# Patient Record
Sex: Female | Born: 1975
Health system: Southern US, Community
[De-identification: ages and names within clinical notes are randomized; demographics above are authoritative.]

## PROBLEM LIST (undated history)

## (undated) DIAGNOSIS — D649 Anemia, unspecified: Secondary | ICD-10-CM

---

## 2017-02-17 DIAGNOSIS — J3081 Allergic rhinitis due to animal (cat) (dog) hair and dander: Secondary | ICD-10-CM | POA: Diagnosis not present

## 2017-02-17 DIAGNOSIS — J301 Allergic rhinitis due to pollen: Secondary | ICD-10-CM | POA: Diagnosis not present

## 2017-02-17 DIAGNOSIS — J3089 Other allergic rhinitis: Secondary | ICD-10-CM | POA: Diagnosis not present

## 2017-03-03 DIAGNOSIS — J3089 Other allergic rhinitis: Secondary | ICD-10-CM | POA: Diagnosis not present

## 2017-03-03 DIAGNOSIS — J3081 Allergic rhinitis due to animal (cat) (dog) hair and dander: Secondary | ICD-10-CM | POA: Diagnosis not present

## 2017-03-03 DIAGNOSIS — J301 Allergic rhinitis due to pollen: Secondary | ICD-10-CM | POA: Diagnosis not present

## 2017-03-17 DIAGNOSIS — J3081 Allergic rhinitis due to animal (cat) (dog) hair and dander: Secondary | ICD-10-CM | POA: Diagnosis not present

## 2017-03-17 DIAGNOSIS — J301 Allergic rhinitis due to pollen: Secondary | ICD-10-CM | POA: Diagnosis not present

## 2017-03-17 DIAGNOSIS — J3089 Other allergic rhinitis: Secondary | ICD-10-CM | POA: Diagnosis not present

## 2017-03-28 DIAGNOSIS — Z08 Encounter for follow-up examination after completed treatment for malignant neoplasm: Secondary | ICD-10-CM | POA: Diagnosis not present

## 2017-03-28 DIAGNOSIS — D224 Melanocytic nevi of scalp and neck: Secondary | ICD-10-CM | POA: Diagnosis not present

## 2017-03-28 DIAGNOSIS — L200 Besnier's prurigo: Secondary | ICD-10-CM | POA: Diagnosis not present

## 2017-03-28 DIAGNOSIS — Z85828 Personal history of other malignant neoplasm of skin: Secondary | ICD-10-CM | POA: Diagnosis not present

## 2017-03-28 DIAGNOSIS — L570 Actinic keratosis: Secondary | ICD-10-CM | POA: Diagnosis not present

## 2017-03-30 DIAGNOSIS — J3081 Allergic rhinitis due to animal (cat) (dog) hair and dander: Secondary | ICD-10-CM | POA: Diagnosis not present

## 2017-03-30 DIAGNOSIS — J301 Allergic rhinitis due to pollen: Secondary | ICD-10-CM | POA: Diagnosis not present

## 2017-03-30 DIAGNOSIS — J3089 Other allergic rhinitis: Secondary | ICD-10-CM | POA: Diagnosis not present

## 2017-04-14 DIAGNOSIS — J3089 Other allergic rhinitis: Secondary | ICD-10-CM | POA: Diagnosis not present

## 2017-04-14 DIAGNOSIS — J3081 Allergic rhinitis due to animal (cat) (dog) hair and dander: Secondary | ICD-10-CM | POA: Diagnosis not present

## 2017-04-14 DIAGNOSIS — J301 Allergic rhinitis due to pollen: Secondary | ICD-10-CM | POA: Diagnosis not present

## 2017-04-19 DIAGNOSIS — Z6828 Body mass index (BMI) 28.0-28.9, adult: Secondary | ICD-10-CM | POA: Diagnosis not present

## 2017-04-19 DIAGNOSIS — Z01419 Encounter for gynecological examination (general) (routine) without abnormal findings: Secondary | ICD-10-CM | POA: Diagnosis not present

## 2017-05-03 DIAGNOSIS — J301 Allergic rhinitis due to pollen: Secondary | ICD-10-CM | POA: Diagnosis not present

## 2017-05-03 DIAGNOSIS — J3081 Allergic rhinitis due to animal (cat) (dog) hair and dander: Secondary | ICD-10-CM | POA: Diagnosis not present

## 2017-05-03 DIAGNOSIS — J3089 Other allergic rhinitis: Secondary | ICD-10-CM | POA: Diagnosis not present

## 2017-05-19 DIAGNOSIS — J3081 Allergic rhinitis due to animal (cat) (dog) hair and dander: Secondary | ICD-10-CM | POA: Diagnosis not present

## 2017-05-19 DIAGNOSIS — J3089 Other allergic rhinitis: Secondary | ICD-10-CM | POA: Diagnosis not present

## 2017-05-19 DIAGNOSIS — J301 Allergic rhinitis due to pollen: Secondary | ICD-10-CM | POA: Diagnosis not present

## 2017-05-31 DIAGNOSIS — J3089 Other allergic rhinitis: Secondary | ICD-10-CM | POA: Diagnosis not present

## 2017-05-31 DIAGNOSIS — J301 Allergic rhinitis due to pollen: Secondary | ICD-10-CM | POA: Diagnosis not present

## 2017-05-31 DIAGNOSIS — J3081 Allergic rhinitis due to animal (cat) (dog) hair and dander: Secondary | ICD-10-CM | POA: Diagnosis not present

## 2017-06-08 DIAGNOSIS — J3089 Other allergic rhinitis: Secondary | ICD-10-CM | POA: Diagnosis not present

## 2017-06-08 DIAGNOSIS — J3081 Allergic rhinitis due to animal (cat) (dog) hair and dander: Secondary | ICD-10-CM | POA: Diagnosis not present

## 2017-06-08 DIAGNOSIS — J301 Allergic rhinitis due to pollen: Secondary | ICD-10-CM | POA: Diagnosis not present

## 2017-06-14 DIAGNOSIS — J3081 Allergic rhinitis due to animal (cat) (dog) hair and dander: Secondary | ICD-10-CM | POA: Diagnosis not present

## 2017-06-14 DIAGNOSIS — J301 Allergic rhinitis due to pollen: Secondary | ICD-10-CM | POA: Diagnosis not present

## 2017-06-14 DIAGNOSIS — J3089 Other allergic rhinitis: Secondary | ICD-10-CM | POA: Diagnosis not present

## 2017-06-29 DIAGNOSIS — J3081 Allergic rhinitis due to animal (cat) (dog) hair and dander: Secondary | ICD-10-CM | POA: Diagnosis not present

## 2017-06-29 DIAGNOSIS — J301 Allergic rhinitis due to pollen: Secondary | ICD-10-CM | POA: Diagnosis not present

## 2017-06-29 DIAGNOSIS — J3089 Other allergic rhinitis: Secondary | ICD-10-CM | POA: Diagnosis not present

## 2017-06-30 DIAGNOSIS — M25521 Pain in right elbow: Secondary | ICD-10-CM | POA: Diagnosis not present

## 2017-07-13 DIAGNOSIS — J3081 Allergic rhinitis due to animal (cat) (dog) hair and dander: Secondary | ICD-10-CM | POA: Diagnosis not present

## 2017-07-13 DIAGNOSIS — J3089 Other allergic rhinitis: Secondary | ICD-10-CM | POA: Diagnosis not present

## 2017-07-13 DIAGNOSIS — J301 Allergic rhinitis due to pollen: Secondary | ICD-10-CM | POA: Diagnosis not present

## 2017-07-28 DIAGNOSIS — J301 Allergic rhinitis due to pollen: Secondary | ICD-10-CM | POA: Diagnosis not present

## 2017-07-28 DIAGNOSIS — J3081 Allergic rhinitis due to animal (cat) (dog) hair and dander: Secondary | ICD-10-CM | POA: Diagnosis not present

## 2017-07-28 DIAGNOSIS — J3089 Other allergic rhinitis: Secondary | ICD-10-CM | POA: Diagnosis not present

## 2017-08-11 DIAGNOSIS — J3089 Other allergic rhinitis: Secondary | ICD-10-CM | POA: Diagnosis not present

## 2017-08-11 DIAGNOSIS — J301 Allergic rhinitis due to pollen: Secondary | ICD-10-CM | POA: Diagnosis not present

## 2017-08-11 DIAGNOSIS — J3081 Allergic rhinitis due to animal (cat) (dog) hair and dander: Secondary | ICD-10-CM | POA: Diagnosis not present

## 2017-08-25 DIAGNOSIS — J3081 Allergic rhinitis due to animal (cat) (dog) hair and dander: Secondary | ICD-10-CM | POA: Diagnosis not present

## 2017-08-25 DIAGNOSIS — J3089 Other allergic rhinitis: Secondary | ICD-10-CM | POA: Diagnosis not present

## 2017-08-25 DIAGNOSIS — J301 Allergic rhinitis due to pollen: Secondary | ICD-10-CM | POA: Diagnosis not present

## 2017-09-08 DIAGNOSIS — J301 Allergic rhinitis due to pollen: Secondary | ICD-10-CM | POA: Diagnosis not present

## 2017-09-08 DIAGNOSIS — J3089 Other allergic rhinitis: Secondary | ICD-10-CM | POA: Diagnosis not present

## 2017-09-08 DIAGNOSIS — J3081 Allergic rhinitis due to animal (cat) (dog) hair and dander: Secondary | ICD-10-CM | POA: Diagnosis not present

## 2017-09-13 DIAGNOSIS — Z23 Encounter for immunization: Secondary | ICD-10-CM | POA: Diagnosis not present

## 2017-09-29 DIAGNOSIS — J3081 Allergic rhinitis due to animal (cat) (dog) hair and dander: Secondary | ICD-10-CM | POA: Diagnosis not present

## 2017-09-29 DIAGNOSIS — J301 Allergic rhinitis due to pollen: Secondary | ICD-10-CM | POA: Diagnosis not present

## 2017-09-29 DIAGNOSIS — J3089 Other allergic rhinitis: Secondary | ICD-10-CM | POA: Diagnosis not present

## 2017-10-12 DIAGNOSIS — Z1231 Encounter for screening mammogram for malignant neoplasm of breast: Secondary | ICD-10-CM | POA: Diagnosis not present

## 2017-10-13 DIAGNOSIS — J301 Allergic rhinitis due to pollen: Secondary | ICD-10-CM | POA: Diagnosis not present

## 2017-10-13 DIAGNOSIS — J3089 Other allergic rhinitis: Secondary | ICD-10-CM | POA: Diagnosis not present

## 2017-10-13 DIAGNOSIS — J3081 Allergic rhinitis due to animal (cat) (dog) hair and dander: Secondary | ICD-10-CM | POA: Diagnosis not present

## 2017-10-31 DIAGNOSIS — J3081 Allergic rhinitis due to animal (cat) (dog) hair and dander: Secondary | ICD-10-CM | POA: Diagnosis not present

## 2017-10-31 DIAGNOSIS — J3089 Other allergic rhinitis: Secondary | ICD-10-CM | POA: Diagnosis not present

## 2017-10-31 DIAGNOSIS — J069 Acute upper respiratory infection, unspecified: Secondary | ICD-10-CM | POA: Diagnosis not present

## 2017-10-31 DIAGNOSIS — J301 Allergic rhinitis due to pollen: Secondary | ICD-10-CM | POA: Diagnosis not present

## 2017-11-10 DIAGNOSIS — J301 Allergic rhinitis due to pollen: Secondary | ICD-10-CM | POA: Diagnosis not present

## 2017-11-10 DIAGNOSIS — J3089 Other allergic rhinitis: Secondary | ICD-10-CM | POA: Diagnosis not present

## 2017-11-10 DIAGNOSIS — J3081 Allergic rhinitis due to animal (cat) (dog) hair and dander: Secondary | ICD-10-CM | POA: Diagnosis not present

## 2017-11-17 DIAGNOSIS — J3081 Allergic rhinitis due to animal (cat) (dog) hair and dander: Secondary | ICD-10-CM | POA: Diagnosis not present

## 2017-11-17 DIAGNOSIS — J3089 Other allergic rhinitis: Secondary | ICD-10-CM | POA: Diagnosis not present

## 2017-11-17 DIAGNOSIS — J301 Allergic rhinitis due to pollen: Secondary | ICD-10-CM | POA: Diagnosis not present

## 2017-12-15 DIAGNOSIS — J3081 Allergic rhinitis due to animal (cat) (dog) hair and dander: Secondary | ICD-10-CM | POA: Diagnosis not present

## 2017-12-15 DIAGNOSIS — J301 Allergic rhinitis due to pollen: Secondary | ICD-10-CM | POA: Diagnosis not present

## 2017-12-15 DIAGNOSIS — J3089 Other allergic rhinitis: Secondary | ICD-10-CM | POA: Diagnosis not present

## 2018-01-05 DIAGNOSIS — J301 Allergic rhinitis due to pollen: Secondary | ICD-10-CM | POA: Diagnosis not present

## 2018-01-05 DIAGNOSIS — J3081 Allergic rhinitis due to animal (cat) (dog) hair and dander: Secondary | ICD-10-CM | POA: Diagnosis not present

## 2018-01-05 DIAGNOSIS — J3089 Other allergic rhinitis: Secondary | ICD-10-CM | POA: Diagnosis not present

## 2018-01-26 DIAGNOSIS — J3089 Other allergic rhinitis: Secondary | ICD-10-CM | POA: Diagnosis not present

## 2018-01-26 DIAGNOSIS — J301 Allergic rhinitis due to pollen: Secondary | ICD-10-CM | POA: Diagnosis not present

## 2018-01-26 DIAGNOSIS — J3081 Allergic rhinitis due to animal (cat) (dog) hair and dander: Secondary | ICD-10-CM | POA: Diagnosis not present

## 2018-02-16 DIAGNOSIS — J3089 Other allergic rhinitis: Secondary | ICD-10-CM | POA: Diagnosis not present

## 2018-02-16 DIAGNOSIS — J3081 Allergic rhinitis due to animal (cat) (dog) hair and dander: Secondary | ICD-10-CM | POA: Diagnosis not present

## 2018-02-16 DIAGNOSIS — J301 Allergic rhinitis due to pollen: Secondary | ICD-10-CM | POA: Diagnosis not present

## 2018-03-09 DIAGNOSIS — J3081 Allergic rhinitis due to animal (cat) (dog) hair and dander: Secondary | ICD-10-CM | POA: Diagnosis not present

## 2018-03-09 DIAGNOSIS — J301 Allergic rhinitis due to pollen: Secondary | ICD-10-CM | POA: Diagnosis not present

## 2018-03-09 DIAGNOSIS — J3089 Other allergic rhinitis: Secondary | ICD-10-CM | POA: Diagnosis not present

## 2018-03-28 DIAGNOSIS — R590 Localized enlarged lymph nodes: Secondary | ICD-10-CM | POA: Diagnosis not present

## 2018-03-29 DIAGNOSIS — Z85828 Personal history of other malignant neoplasm of skin: Secondary | ICD-10-CM | POA: Diagnosis not present

## 2018-03-29 DIAGNOSIS — J301 Allergic rhinitis due to pollen: Secondary | ICD-10-CM | POA: Diagnosis not present

## 2018-03-29 DIAGNOSIS — D225 Melanocytic nevi of trunk: Secondary | ICD-10-CM | POA: Diagnosis not present

## 2018-03-29 DIAGNOSIS — J3081 Allergic rhinitis due to animal (cat) (dog) hair and dander: Secondary | ICD-10-CM | POA: Diagnosis not present

## 2018-03-29 DIAGNOSIS — L821 Other seborrheic keratosis: Secondary | ICD-10-CM | POA: Diagnosis not present

## 2018-03-29 DIAGNOSIS — L570 Actinic keratosis: Secondary | ICD-10-CM | POA: Diagnosis not present

## 2018-03-29 DIAGNOSIS — J3089 Other allergic rhinitis: Secondary | ICD-10-CM | POA: Diagnosis not present

## 2018-03-30 ENCOUNTER — Other Ambulatory Visit: Payer: Self-pay | Admitting: Physician Assistant

## 2018-03-30 DIAGNOSIS — R590 Localized enlarged lymph nodes: Secondary | ICD-10-CM

## 2018-04-20 DIAGNOSIS — J3081 Allergic rhinitis due to animal (cat) (dog) hair and dander: Secondary | ICD-10-CM | POA: Diagnosis not present

## 2018-04-20 DIAGNOSIS — J301 Allergic rhinitis due to pollen: Secondary | ICD-10-CM | POA: Diagnosis not present

## 2018-04-20 DIAGNOSIS — J3089 Other allergic rhinitis: Secondary | ICD-10-CM | POA: Diagnosis not present

## 2018-04-30 DIAGNOSIS — J209 Acute bronchitis, unspecified: Secondary | ICD-10-CM | POA: Diagnosis not present

## 2018-05-11 DIAGNOSIS — J301 Allergic rhinitis due to pollen: Secondary | ICD-10-CM | POA: Diagnosis not present

## 2018-05-11 DIAGNOSIS — J3089 Other allergic rhinitis: Secondary | ICD-10-CM | POA: Diagnosis not present

## 2018-05-11 DIAGNOSIS — J3081 Allergic rhinitis due to animal (cat) (dog) hair and dander: Secondary | ICD-10-CM | POA: Diagnosis not present

## 2018-05-20 DIAGNOSIS — J3081 Allergic rhinitis due to animal (cat) (dog) hair and dander: Secondary | ICD-10-CM | POA: Diagnosis not present

## 2018-05-20 DIAGNOSIS — J301 Allergic rhinitis due to pollen: Secondary | ICD-10-CM | POA: Diagnosis not present

## 2018-05-20 DIAGNOSIS — J3089 Other allergic rhinitis: Secondary | ICD-10-CM | POA: Diagnosis not present

## 2018-06-01 DIAGNOSIS — J3089 Other allergic rhinitis: Secondary | ICD-10-CM | POA: Diagnosis not present

## 2018-06-01 DIAGNOSIS — J3081 Allergic rhinitis due to animal (cat) (dog) hair and dander: Secondary | ICD-10-CM | POA: Diagnosis not present

## 2018-06-01 DIAGNOSIS — J301 Allergic rhinitis due to pollen: Secondary | ICD-10-CM | POA: Diagnosis not present

## 2018-06-20 DIAGNOSIS — J301 Allergic rhinitis due to pollen: Secondary | ICD-10-CM | POA: Diagnosis not present

## 2018-06-20 DIAGNOSIS — J3089 Other allergic rhinitis: Secondary | ICD-10-CM | POA: Diagnosis not present

## 2018-06-20 DIAGNOSIS — J3081 Allergic rhinitis due to animal (cat) (dog) hair and dander: Secondary | ICD-10-CM | POA: Diagnosis not present

## 2018-07-11 DIAGNOSIS — J301 Allergic rhinitis due to pollen: Secondary | ICD-10-CM | POA: Diagnosis not present

## 2018-07-11 DIAGNOSIS — Z6832 Body mass index (BMI) 32.0-32.9, adult: Secondary | ICD-10-CM | POA: Diagnosis not present

## 2018-07-11 DIAGNOSIS — J3081 Allergic rhinitis due to animal (cat) (dog) hair and dander: Secondary | ICD-10-CM | POA: Diagnosis not present

## 2018-07-11 DIAGNOSIS — Z01419 Encounter for gynecological examination (general) (routine) without abnormal findings: Secondary | ICD-10-CM | POA: Diagnosis not present

## 2018-07-11 DIAGNOSIS — J3089 Other allergic rhinitis: Secondary | ICD-10-CM | POA: Diagnosis not present

## 2018-08-10 DIAGNOSIS — J3089 Other allergic rhinitis: Secondary | ICD-10-CM | POA: Diagnosis not present

## 2018-08-10 DIAGNOSIS — J301 Allergic rhinitis due to pollen: Secondary | ICD-10-CM | POA: Diagnosis not present

## 2018-08-10 DIAGNOSIS — J3081 Allergic rhinitis due to animal (cat) (dog) hair and dander: Secondary | ICD-10-CM | POA: Diagnosis not present

## 2018-08-18 DIAGNOSIS — Z23 Encounter for immunization: Secondary | ICD-10-CM | POA: Diagnosis not present

## 2018-08-31 DIAGNOSIS — J301 Allergic rhinitis due to pollen: Secondary | ICD-10-CM | POA: Diagnosis not present

## 2018-08-31 DIAGNOSIS — J3081 Allergic rhinitis due to animal (cat) (dog) hair and dander: Secondary | ICD-10-CM | POA: Diagnosis not present

## 2018-08-31 DIAGNOSIS — J3089 Other allergic rhinitis: Secondary | ICD-10-CM | POA: Diagnosis not present

## 2018-09-21 DIAGNOSIS — J301 Allergic rhinitis due to pollen: Secondary | ICD-10-CM | POA: Diagnosis not present

## 2018-09-21 DIAGNOSIS — J3089 Other allergic rhinitis: Secondary | ICD-10-CM | POA: Diagnosis not present

## 2018-09-21 DIAGNOSIS — J3081 Allergic rhinitis due to animal (cat) (dog) hair and dander: Secondary | ICD-10-CM | POA: Diagnosis not present

## 2018-10-11 DIAGNOSIS — J3081 Allergic rhinitis due to animal (cat) (dog) hair and dander: Secondary | ICD-10-CM | POA: Diagnosis not present

## 2018-10-11 DIAGNOSIS — J3089 Other allergic rhinitis: Secondary | ICD-10-CM | POA: Diagnosis not present

## 2018-10-11 DIAGNOSIS — J301 Allergic rhinitis due to pollen: Secondary | ICD-10-CM | POA: Diagnosis not present

## 2018-10-18 DIAGNOSIS — R05 Cough: Secondary | ICD-10-CM | POA: Diagnosis not present

## 2018-10-18 DIAGNOSIS — J45990 Exercise induced bronchospasm: Secondary | ICD-10-CM | POA: Diagnosis not present

## 2018-10-18 DIAGNOSIS — R0609 Other forms of dyspnea: Secondary | ICD-10-CM | POA: Diagnosis not present

## 2018-10-31 DIAGNOSIS — J301 Allergic rhinitis due to pollen: Secondary | ICD-10-CM | POA: Diagnosis not present

## 2018-10-31 DIAGNOSIS — J3081 Allergic rhinitis due to animal (cat) (dog) hair and dander: Secondary | ICD-10-CM | POA: Diagnosis not present

## 2018-10-31 DIAGNOSIS — J3089 Other allergic rhinitis: Secondary | ICD-10-CM | POA: Diagnosis not present

## 2018-10-31 DIAGNOSIS — R05 Cough: Secondary | ICD-10-CM | POA: Diagnosis not present

## 2018-11-10 DIAGNOSIS — Z1231 Encounter for screening mammogram for malignant neoplasm of breast: Secondary | ICD-10-CM | POA: Diagnosis not present

## 2018-11-23 DIAGNOSIS — J3081 Allergic rhinitis due to animal (cat) (dog) hair and dander: Secondary | ICD-10-CM | POA: Diagnosis not present

## 2018-11-23 DIAGNOSIS — J301 Allergic rhinitis due to pollen: Secondary | ICD-10-CM | POA: Diagnosis not present

## 2018-11-23 DIAGNOSIS — J3089 Other allergic rhinitis: Secondary | ICD-10-CM | POA: Diagnosis not present

## 2018-12-21 DIAGNOSIS — J3089 Other allergic rhinitis: Secondary | ICD-10-CM | POA: Diagnosis not present

## 2018-12-21 DIAGNOSIS — J3081 Allergic rhinitis due to animal (cat) (dog) hair and dander: Secondary | ICD-10-CM | POA: Diagnosis not present

## 2018-12-21 DIAGNOSIS — J301 Allergic rhinitis due to pollen: Secondary | ICD-10-CM | POA: Diagnosis not present

## 2019-01-10 DIAGNOSIS — J3089 Other allergic rhinitis: Secondary | ICD-10-CM | POA: Diagnosis not present

## 2019-01-10 DIAGNOSIS — J301 Allergic rhinitis due to pollen: Secondary | ICD-10-CM | POA: Diagnosis not present

## 2019-01-10 DIAGNOSIS — J3081 Allergic rhinitis due to animal (cat) (dog) hair and dander: Secondary | ICD-10-CM | POA: Diagnosis not present

## 2019-02-08 DIAGNOSIS — J3089 Other allergic rhinitis: Secondary | ICD-10-CM | POA: Diagnosis not present

## 2019-02-08 DIAGNOSIS — J301 Allergic rhinitis due to pollen: Secondary | ICD-10-CM | POA: Diagnosis not present

## 2019-02-08 DIAGNOSIS — J3081 Allergic rhinitis due to animal (cat) (dog) hair and dander: Secondary | ICD-10-CM | POA: Diagnosis not present

## 2019-03-06 DIAGNOSIS — J0100 Acute maxillary sinusitis, unspecified: Secondary | ICD-10-CM | POA: Diagnosis not present

## 2019-03-15 DIAGNOSIS — J3081 Allergic rhinitis due to animal (cat) (dog) hair and dander: Secondary | ICD-10-CM | POA: Diagnosis not present

## 2019-03-15 DIAGNOSIS — J301 Allergic rhinitis due to pollen: Secondary | ICD-10-CM | POA: Diagnosis not present

## 2019-03-15 DIAGNOSIS — J3089 Other allergic rhinitis: Secondary | ICD-10-CM | POA: Diagnosis not present

## 2019-04-02 DIAGNOSIS — I781 Nevus, non-neoplastic: Secondary | ICD-10-CM | POA: Diagnosis not present

## 2019-04-02 DIAGNOSIS — Z85828 Personal history of other malignant neoplasm of skin: Secondary | ICD-10-CM | POA: Diagnosis not present

## 2019-04-02 DIAGNOSIS — L821 Other seborrheic keratosis: Secondary | ICD-10-CM | POA: Diagnosis not present

## 2019-04-02 DIAGNOSIS — L820 Inflamed seborrheic keratosis: Secondary | ICD-10-CM | POA: Diagnosis not present

## 2019-04-02 DIAGNOSIS — L249 Irritant contact dermatitis, unspecified cause: Secondary | ICD-10-CM | POA: Diagnosis not present

## 2019-04-10 DIAGNOSIS — J3081 Allergic rhinitis due to animal (cat) (dog) hair and dander: Secondary | ICD-10-CM | POA: Diagnosis not present

## 2019-04-10 DIAGNOSIS — J301 Allergic rhinitis due to pollen: Secondary | ICD-10-CM | POA: Diagnosis not present

## 2019-04-10 DIAGNOSIS — J3089 Other allergic rhinitis: Secondary | ICD-10-CM | POA: Diagnosis not present

## 2019-04-11 DIAGNOSIS — J3081 Allergic rhinitis due to animal (cat) (dog) hair and dander: Secondary | ICD-10-CM | POA: Diagnosis not present

## 2019-04-11 DIAGNOSIS — J301 Allergic rhinitis due to pollen: Secondary | ICD-10-CM | POA: Diagnosis not present

## 2019-04-11 DIAGNOSIS — J3089 Other allergic rhinitis: Secondary | ICD-10-CM | POA: Diagnosis not present

## 2019-05-02 DIAGNOSIS — Z6833 Body mass index (BMI) 33.0-33.9, adult: Secondary | ICD-10-CM | POA: Diagnosis not present

## 2019-05-02 DIAGNOSIS — N920 Excessive and frequent menstruation with regular cycle: Secondary | ICD-10-CM | POA: Diagnosis not present

## 2019-05-02 DIAGNOSIS — Z01419 Encounter for gynecological examination (general) (routine) without abnormal findings: Secondary | ICD-10-CM | POA: Diagnosis not present

## 2019-05-02 DIAGNOSIS — Z130 Encounter for screening for diseases of the blood and blood-forming organs and certain disorders involving the immune mechanism: Secondary | ICD-10-CM | POA: Diagnosis not present

## 2019-05-03 DIAGNOSIS — N920 Excessive and frequent menstruation with regular cycle: Secondary | ICD-10-CM | POA: Diagnosis not present

## 2019-05-03 DIAGNOSIS — D250 Submucous leiomyoma of uterus: Secondary | ICD-10-CM | POA: Diagnosis not present

## 2019-05-10 DIAGNOSIS — J3089 Other allergic rhinitis: Secondary | ICD-10-CM | POA: Diagnosis not present

## 2019-05-10 DIAGNOSIS — J301 Allergic rhinitis due to pollen: Secondary | ICD-10-CM | POA: Diagnosis not present

## 2019-05-10 DIAGNOSIS — J3081 Allergic rhinitis due to animal (cat) (dog) hair and dander: Secondary | ICD-10-CM | POA: Diagnosis not present

## 2019-06-07 DIAGNOSIS — J3081 Allergic rhinitis due to animal (cat) (dog) hair and dander: Secondary | ICD-10-CM | POA: Diagnosis not present

## 2019-06-07 DIAGNOSIS — J301 Allergic rhinitis due to pollen: Secondary | ICD-10-CM | POA: Diagnosis not present

## 2019-06-07 DIAGNOSIS — J3089 Other allergic rhinitis: Secondary | ICD-10-CM | POA: Diagnosis not present

## 2019-07-05 DIAGNOSIS — J301 Allergic rhinitis due to pollen: Secondary | ICD-10-CM | POA: Diagnosis not present

## 2019-07-05 DIAGNOSIS — J3089 Other allergic rhinitis: Secondary | ICD-10-CM | POA: Diagnosis not present

## 2019-07-05 DIAGNOSIS — J3081 Allergic rhinitis due to animal (cat) (dog) hair and dander: Secondary | ICD-10-CM | POA: Diagnosis not present

## 2019-08-02 DIAGNOSIS — J3089 Other allergic rhinitis: Secondary | ICD-10-CM | POA: Diagnosis not present

## 2019-08-02 DIAGNOSIS — J3081 Allergic rhinitis due to animal (cat) (dog) hair and dander: Secondary | ICD-10-CM | POA: Diagnosis not present

## 2019-08-02 DIAGNOSIS — J301 Allergic rhinitis due to pollen: Secondary | ICD-10-CM | POA: Diagnosis not present

## 2019-08-17 DIAGNOSIS — Z23 Encounter for immunization: Secondary | ICD-10-CM | POA: Diagnosis not present

## 2019-08-30 DIAGNOSIS — J3081 Allergic rhinitis due to animal (cat) (dog) hair and dander: Secondary | ICD-10-CM | POA: Diagnosis not present

## 2019-08-30 DIAGNOSIS — J3089 Other allergic rhinitis: Secondary | ICD-10-CM | POA: Diagnosis not present

## 2019-08-30 DIAGNOSIS — J301 Allergic rhinitis due to pollen: Secondary | ICD-10-CM | POA: Diagnosis not present

## 2019-09-21 DIAGNOSIS — Z20828 Contact with and (suspected) exposure to other viral communicable diseases: Secondary | ICD-10-CM | POA: Diagnosis not present

## 2019-09-27 DIAGNOSIS — J301 Allergic rhinitis due to pollen: Secondary | ICD-10-CM | POA: Diagnosis not present

## 2019-09-27 DIAGNOSIS — J3089 Other allergic rhinitis: Secondary | ICD-10-CM | POA: Diagnosis not present

## 2019-09-27 DIAGNOSIS — J3081 Allergic rhinitis due to animal (cat) (dog) hair and dander: Secondary | ICD-10-CM | POA: Diagnosis not present

## 2019-10-23 DIAGNOSIS — J3081 Allergic rhinitis due to animal (cat) (dog) hair and dander: Secondary | ICD-10-CM | POA: Diagnosis not present

## 2019-10-23 DIAGNOSIS — J3089 Other allergic rhinitis: Secondary | ICD-10-CM | POA: Diagnosis not present

## 2019-10-23 DIAGNOSIS — J301 Allergic rhinitis due to pollen: Secondary | ICD-10-CM | POA: Diagnosis not present

## 2019-11-20 DIAGNOSIS — J3089 Other allergic rhinitis: Secondary | ICD-10-CM | POA: Diagnosis not present

## 2019-11-20 DIAGNOSIS — J301 Allergic rhinitis due to pollen: Secondary | ICD-10-CM | POA: Diagnosis not present

## 2019-11-20 DIAGNOSIS — J3081 Allergic rhinitis due to animal (cat) (dog) hair and dander: Secondary | ICD-10-CM | POA: Diagnosis not present

## 2019-12-07 DIAGNOSIS — Z1231 Encounter for screening mammogram for malignant neoplasm of breast: Secondary | ICD-10-CM | POA: Diagnosis not present

## 2019-12-18 DIAGNOSIS — J301 Allergic rhinitis due to pollen: Secondary | ICD-10-CM | POA: Diagnosis not present

## 2019-12-18 DIAGNOSIS — J3081 Allergic rhinitis due to animal (cat) (dog) hair and dander: Secondary | ICD-10-CM | POA: Diagnosis not present

## 2019-12-18 DIAGNOSIS — J3089 Other allergic rhinitis: Secondary | ICD-10-CM | POA: Diagnosis not present

## 2019-12-26 DIAGNOSIS — J4530 Mild persistent asthma, uncomplicated: Secondary | ICD-10-CM | POA: Diagnosis not present

## 2019-12-26 DIAGNOSIS — J3081 Allergic rhinitis due to animal (cat) (dog) hair and dander: Secondary | ICD-10-CM | POA: Diagnosis not present

## 2019-12-26 DIAGNOSIS — J301 Allergic rhinitis due to pollen: Secondary | ICD-10-CM | POA: Diagnosis not present

## 2019-12-26 DIAGNOSIS — J3089 Other allergic rhinitis: Secondary | ICD-10-CM | POA: Diagnosis not present

## 2020-01-17 DIAGNOSIS — J3089 Other allergic rhinitis: Secondary | ICD-10-CM | POA: Diagnosis not present

## 2020-01-17 DIAGNOSIS — J3081 Allergic rhinitis due to animal (cat) (dog) hair and dander: Secondary | ICD-10-CM | POA: Diagnosis not present

## 2020-01-17 DIAGNOSIS — J301 Allergic rhinitis due to pollen: Secondary | ICD-10-CM | POA: Diagnosis not present

## 2020-02-06 DIAGNOSIS — M791 Myalgia, unspecified site: Secondary | ICD-10-CM | POA: Diagnosis not present

## 2020-02-06 DIAGNOSIS — R002 Palpitations: Secondary | ICD-10-CM | POA: Diagnosis not present

## 2020-02-06 DIAGNOSIS — F419 Anxiety disorder, unspecified: Secondary | ICD-10-CM | POA: Diagnosis not present

## 2020-02-07 ENCOUNTER — Encounter (HOSPITAL_BASED_OUTPATIENT_CLINIC_OR_DEPARTMENT_OTHER): Payer: Self-pay

## 2020-02-07 ENCOUNTER — Other Ambulatory Visit: Payer: Self-pay

## 2020-02-07 ENCOUNTER — Emergency Department (HOSPITAL_BASED_OUTPATIENT_CLINIC_OR_DEPARTMENT_OTHER)
Admission: EM | Admit: 2020-02-07 | Discharge: 2020-02-07 | Disposition: A | Discharge status: Home / Self Care | Payer: BC Managed Care – PPO | Attending: Emergency Medicine | Admitting: Emergency Medicine

## 2020-02-07 ENCOUNTER — Emergency Department (HOSPITAL_BASED_OUTPATIENT_CLINIC_OR_DEPARTMENT_OTHER)
Admission: EM | Admit: 2020-02-07 | Discharge: 2020-02-07 | Disposition: A | Discharge status: Home / Self Care | Payer: BC Managed Care – PPO

## 2020-02-07 ENCOUNTER — Emergency Department (HOSPITAL_BASED_OUTPATIENT_CLINIC_OR_DEPARTMENT_OTHER): Payer: BC Managed Care – PPO

## 2020-02-07 DIAGNOSIS — D649 Anemia, unspecified: Secondary | ICD-10-CM | POA: Diagnosis not present

## 2020-02-07 DIAGNOSIS — N920 Excessive and frequent menstruation with regular cycle: Secondary | ICD-10-CM | POA: Insufficient documentation

## 2020-02-07 DIAGNOSIS — R002 Palpitations: Secondary | ICD-10-CM

## 2020-02-07 DIAGNOSIS — R079 Chest pain, unspecified: Secondary | ICD-10-CM | POA: Diagnosis not present

## 2020-02-07 HISTORY — DX: Anemia, unspecified: D64.9

## 2020-02-07 LAB — CBC WITH DIFFERENTIAL/PLATELET
Abs Immature Granulocytes: 0.03 10*3/uL (ref 0.00–0.07)
Basophils Absolute: 0 10*3/uL (ref 0.0–0.1)
Basophils Relative: 1 %
Eosinophils Absolute: 0.5 10*3/uL (ref 0.0–0.5)
Eosinophils Relative: 6 %
HCT: 26.9 % — ABNORMAL LOW (ref 36.0–46.0)
Hemoglobin: 7.2 g/dL — ABNORMAL LOW (ref 12.0–15.0)
Immature Granulocytes: 0 %
Lymphocytes Relative: 28 %
Lymphs Abs: 2.2 10*3/uL (ref 0.7–4.0)
MCH: 17.2 pg — ABNORMAL LOW (ref 26.0–34.0)
MCHC: 26.8 g/dL — ABNORMAL LOW (ref 30.0–36.0)
MCV: 64.2 fL — ABNORMAL LOW (ref 80.0–100.0)
Monocytes Absolute: 0.5 10*3/uL (ref 0.1–1.0)
Monocytes Relative: 7 %
Neutro Abs: 4.6 10*3/uL (ref 1.7–7.7)
Neutrophils Relative %: 58 %
Platelets: 405 10*3/uL — ABNORMAL HIGH (ref 150–400)
RBC: 4.19 MIL/uL (ref 3.87–5.11)
RDW: 19.8 % — ABNORMAL HIGH (ref 11.5–15.5)
WBC: 7.9 10*3/uL (ref 4.0–10.5)
nRBC: 0 % (ref 0.0–0.2)

## 2020-02-07 LAB — BASIC METABOLIC PANEL
Anion gap: 9 (ref 5–15)
BUN: 12 mg/dL (ref 6–20)
CO2: 23 mmol/L (ref 22–32)
Calcium: 8.9 mg/dL (ref 8.9–10.3)
Chloride: 103 mmol/L (ref 98–111)
Creatinine, Ser: 0.86 mg/dL (ref 0.44–1.00)
GFR calc Af Amer: >60 mL/min (ref >60)
GFR calc non Af Amer: >60 mL/min (ref >60)
Glucose, Bld: 126 mg/dL — ABNORMAL HIGH (ref 70–99)
Potassium: 3.3 mmol/L — ABNORMAL LOW (ref 3.5–5.1)
Sodium: 135 mmol/L (ref 135–145)

## 2020-02-07 LAB — TROPONIN I (HIGH SENSITIVITY): Troponin I (High Sensitivity): 18 ng/L — ABNORMAL HIGH (ref <18)

## 2020-02-07 LAB — PREGNANCY, URINE: Preg Test, Ur: NEGATIVE

## 2020-02-07 MED ORDER — FERROUS SULFATE 325 (65 FE) MG PO TABS: 325.0000 mg | ORAL_TABLET | Freq: Every day | ORAL | 0 refills | Status: DC

## 2020-02-07 NOTE — ED Provider Notes (Signed)
[facility] EMERGENCY DEPARTMENT Provider Note   CSN: [id number] Arrival date & time: 02/07/20  1747     History Chief Complaint  Patient presents with  . Palpitations  . Anemia    Kathy Shields is a 44 y.o. female.  The history is provided by the patient.  Palpitations Palpitations quality:  Fast Onset quality:  Gradual Timing:  Intermittent Progression:  Waxing and waning Chronicity:  New Context comment:  Patient with heavy menstrual cycles over the last year, new for here. Found to have hemoglobin of 7 at PCP yesterday and sent for possible transfusion. Patient denies SOB, syncope. No black stools. Relieved by:  Nothing Worsened by:  Nothing Associated symptoms: no back pain, no chest pain, no cough, no malaise/fatigue, no nausea, no shortness of breath and no vomiting        Past Medical History:  Diagnosis Date  . Anemia     There are no problems to display for this patient.   Past Surgical History:  Procedure Laterality Date  . CESAREAN SECTION       OB History   No obstetric history on file.     No family history on file.  Social History   Tobacco Use  . Smoking status: Never Smoker  . Smokeless tobacco: Never Used  Substance Use Topics  . Alcohol use: Yes    Comment: occ  . Drug use: Never    Home Medications Prior to Admission medications   Medication Sig Start Date End Date Taking? Authorizing Provider  ferrous sulfate 325 (65 FE) MG tablet Take 1 tablet (325 mg total) by mouth daily. 02/07/20 03/08/20  Virgina Norfolk, DO    Allergies    Patient has no known allergies.  Review of Systems   Review of Systems  Constitutional: Negative for chills, fever and malaise/fatigue.  HENT: Negative for ear pain and sore throat.   Eyes: Negative for pain and visual disturbance.  Respiratory: Negative for cough and shortness of breath.   Cardiovascular: Positive for palpitations. Negative for chest pain.  Gastrointestinal: Negative  for abdominal pain, nausea and vomiting.  Genitourinary: Positive for menstrual problem. Negative for dysuria, hematuria, vaginal bleeding, vaginal discharge and vaginal pain.  Musculoskeletal: Negative for arthralgias and back pain.  Skin: Negative for color change and rash.  Neurological: Negative for seizures and syncope.  All other systems reviewed and are negative.   Physical Exam Updated Vital Signs  ED Triage Vitals  Enc Vitals Group     BP 02/07/20 1812 (!) 158/83     Pulse Rate 02/07/20 1812 (!) 101     Resp 02/07/20 1812 18     Temp 02/07/20 1812 99.2 F (37.3 C)     Temp Source 02/07/20 1812 Oral     SpO2 02/07/20 1812 100 %     Weight 02/07/20 1812 185 lb 6.5 oz (84.1 kg)     Height 02/07/20 1812 5\' 3"  (1.6 m)     Head Circumference --      Peak Flow --      Pain Score 02/07/20 1809 2     Pain Loc --      Pain Edu? --      Excl. in GC? --     Physical Exam Vitals and nursing note reviewed.  Constitutional:      General: She is not in acute distress.    Appearance: She is well-developed. She is not ill-appearing.  HENT:     Head: Normocephalic and  atraumatic.     Nose: Nose normal.     Mouth/Throat:     Mouth: Mucous membranes are moist.  Eyes:     Conjunctiva/sclera: Conjunctivae normal.     Comments: Pale conjuctiva  Cardiovascular:     Rate and Rhythm: Normal rate and regular rhythm.     Pulses: Normal pulses.     Heart sounds: Normal heart sounds. No murmur.  Pulmonary:     Effort: Pulmonary effort is normal. No respiratory distress.     Breath sounds: Normal breath sounds.  Abdominal:     General: Abdomen is flat.     Palpations: Abdomen is soft.     Tenderness: There is no abdominal tenderness.  Musculoskeletal:        General: No tenderness.     Cervical back: Normal range of motion and neck supple.  Skin:    General: Skin is warm and dry.     Coloration: Skin is pale.  Neurological:     General: No focal deficit present.     Mental  Status: She is alert.     ED Results / Procedures / Treatments   Labs (all labs ordered are listed, but only abnormal results are displayed) Labs Reviewed  CBC WITH DIFFERENTIAL/PLATELET - Abnormal; Notable for the following components:      Result Value   Hemoglobin 7.2 (*)    HCT 26.9 (*)    MCV 64.2 (*)    MCH 17.2 (*)    MCHC 26.8 (*)    RDW 19.8 (*)    Platelets 405 (*)    All other components within normal limits  BASIC METABOLIC PANEL - Abnormal; Notable for the following components:   Potassium 3.3 (*)    Glucose, Bld 126 (*)    All other components within normal limits  TROPONIN I (HIGH SENSITIVITY) - Abnormal; Notable for the following components:   Troponin I (High Sensitivity) 18 (*)    All other components within normal limits  PREGNANCY, URINE    EKG EKG Interpretation  Date/Time:  Thursday February 07 2020 18:10:03 EDT Ventricular Rate:  89 PR Interval:  162 QRS Duration: 80 QT Interval:  372 QTC Calculation: 452 R Axis:   70 Text Interpretation: Sinus rhythm with occasional Premature ventricular complexes Nonspecific ST abnormality Abnormal ECG Confirmed by Virgina Norfolk [phone]) on 02/07/2020 6:21:08 PM   Radiology DG Chest Portable 1 View  Result Date: 02/07/2020 CLINICAL DATA:  Chest pain. Additional history provided: Patient reports right-sided chest pain beginning 4 days ago. EXAM: PORTABLE CHEST 1 VIEW COMPARISON:  Chest radiograph 10/18/2018 FINDINGS: Heart size within normal limits. There is no evidence of airspace consolidation within the lungs. No evidence of pleural effusion or pneumothorax. No acute bony abnormality. IMPRESSION: No evidence of acute cardiopulmonary abnormality. Electronically Signed   By: Jackey Loge DO   On: 02/07/2020 18:35    Procedures Procedures (including critical care time)  Medications Ordered in ED Medications - No data to display  ED Course  I have reviewed the triage vital signs and the nursing  notes.  Pertinent labs & imaging results that were available during my care of the patient were reviewed by me and considered in my medical decision making (see chart for details).    MDM Rules/Calculators/A&P                      Kathy Shields is a 44 year old female with no significant medical history who presents to the  ED with abnormal lab.  Had a hemoglobin of 7 yesterday at primary care doctor's office.  Has been having heavy menstrual cycles for the past year.  Has seen her OB about this and they started her on hormone therapy but her menstrual cycles are still fairly heavy.  No active bleeding now.  No black stools.  Has had some palpitations during this time.  EKG shows sinus rhythm.  No ischemic changes.  She does appear to have PVCs which is likely her palpitations.  Hemoglobin today 7.2.  Troponin 18.   Otherwise lab work is unremarkable.  MCV is below 70 and overall patient has iron deficient anemia most likely.  No concern for ACS.  No cardiac risk factors.  Patient has not had chest pain.  Has palpitations.  EKG reassuring.  Heart score 0.  We will however started on iron pills however recommend that she talk to her primary care doctor about proper dosing.  Possibly refer her for iron transfusion.  She will also need follow-up with OB as she may be a candidate for an ablation or other procedure to help with her menstrual bleeding.  She is given return precautions and discharged from the ED in good condition.  She knows to return to the ED if she develops worsening symptoms of anemia including shortness of breath, chest pain, syncope, lightheadedness.  This chart was dictated using voice recognition software.  Despite best efforts to proofread,  errors can occur which can change the documentation meaning.     Final Clinical Impression(s) / ED Diagnoses Final diagnoses:  Palpitations  Anemia, unspecified type    Rx / DC Orders ED Discharge Orders         Ordered    ferrous sulfate  325 (65 FE) MG tablet  Daily     02/07/20 1849           Lennice Sites, DO 02/07/20 1918

## 2020-02-07 NOTE — Discharge Instructions (Addendum)
Talk to your primary care doctor about proper supplemental iron dose.  You likely need iron transfusion as well see if they can arrange for that.  Follow-up with your OB/GYN to discuss your menstrual bleeding.  Possibly you may be a candidate for an ablation or other procedure to help with her bleeding.  Please return to the ED if you have any other severe symptoms.

## 2020-02-07 NOTE — ED Notes (Signed)
ED Provider at bedside. 

## 2020-02-07 NOTE — ED Triage Notes (Signed)
Pt c/o right side CP x 4 days ago-was seen by PCP for same yesterday-dx with muscle strain and states pain is better-pt also c/o palpitations x "months"-states labs drawn-no EKG yesterday-was called by PCP office and advised to come to ED due to "hgb 7"-NAD-steady gait

## 2020-02-08 ENCOUNTER — Other Ambulatory Visit: Payer: Self-pay

## 2020-02-08 ENCOUNTER — Emergency Department (HOSPITAL_COMMUNITY)
Admission: EM | Admit: 2020-02-08 | Discharge: 2020-02-08 | Disposition: A | Discharge status: Home / Self Care | Payer: BC Managed Care – PPO | Attending: Emergency Medicine | Admitting: Emergency Medicine

## 2020-02-08 ENCOUNTER — Encounter (HOSPITAL_COMMUNITY): Payer: Self-pay

## 2020-02-08 DIAGNOSIS — N920 Excessive and frequent menstruation with regular cycle: Secondary | ICD-10-CM | POA: Insufficient documentation

## 2020-02-08 DIAGNOSIS — D500 Iron deficiency anemia secondary to blood loss (chronic): Secondary | ICD-10-CM | POA: Diagnosis not present

## 2020-02-08 DIAGNOSIS — D649 Anemia, unspecified: Secondary | ICD-10-CM | POA: Insufficient documentation

## 2020-02-08 LAB — COMPREHENSIVE METABOLIC PANEL
ALT: 14 U/L (ref 0–44)
AST: 18 U/L (ref 15–41)
Albumin: 3.8 g/dL (ref 3.5–5.0)
Alkaline Phosphatase: 48 U/L (ref 38–126)
Anion gap: 8 (ref 5–15)
BUN: 10 mg/dL (ref 6–20)
CO2: 25 mmol/L (ref 22–32)
Calcium: 9 mg/dL (ref 8.9–10.3)
Chloride: 106 mmol/L (ref 98–111)
Creatinine, Ser: 0.84 mg/dL (ref 0.44–1.00)
GFR calc Af Amer: >60 mL/min (ref >60)
GFR calc non Af Amer: >60 mL/min (ref >60)
Glucose, Bld: 101 mg/dL — ABNORMAL HIGH (ref 70–99)
Potassium: 4 mmol/L (ref 3.5–5.1)
Sodium: 139 mmol/L (ref 135–145)
Total Bilirubin: 0.2 mg/dL — ABNORMAL LOW (ref 0.3–1.2)
Total Protein: 7.9 g/dL (ref 6.5–8.1)

## 2020-02-08 LAB — TYPE AND SCREEN
ABO/RH(D): O POS
Antibody Screen: NEGATIVE

## 2020-02-08 LAB — CBC
HCT: 27.4 % — ABNORMAL LOW (ref 36.0–46.0)
Hemoglobin: 7.2 g/dL — ABNORMAL LOW (ref 12.0–15.0)
MCH: 17.3 pg — ABNORMAL LOW (ref 26.0–34.0)
MCHC: 26.3 g/dL — ABNORMAL LOW (ref 30.0–36.0)
MCV: 65.7 fL — ABNORMAL LOW (ref 80.0–100.0)
Platelets: 403 10*3/uL — ABNORMAL HIGH (ref 150–400)
RBC: 4.17 MIL/uL (ref 3.87–5.11)
RDW: 19.9 % — ABNORMAL HIGH (ref 11.5–15.5)
WBC: 7.2 10*3/uL (ref 4.0–10.5)
nRBC: 0 % (ref 0.0–0.2)

## 2020-02-08 LAB — I-STAT BETA HCG BLOOD, ED (MC, WL, AP ONLY): I-stat hCG, quantitative: <5 m[IU]/mL (ref <5)

## 2020-02-08 NOTE — ED Notes (Signed)
ED Provider at bedside. 

## 2020-02-08 NOTE — ED Triage Notes (Signed)
Patient reports that she went to [facility] yesterday and was told her Hgb was 7.2.

## 2020-02-08 NOTE — ED Provider Notes (Signed)
[facility]-EMERGENCY DEPT Provider Note   CSN: [id number] Arrival date & time: 02/08/20  1331     History Chief Complaint  Patient presents with  . Abnormal Lab    Kathy Shields is a 44 y.o. female.  44 year old female who presents with concern for her recent diagnosis of anemia.  Was seen at [facility] and had a hemoglobin of 7.2.  The etiology of her anemia is from heavy menstrual periods.  Saw her doctor today who is scheduling an iron infusion for next week.  She denies any vaginal bleeding at this time.  States that this problem is been going on for quite some time and does not know any new dyspnea or weakness.  She is currently on iron therapy        Past Medical History:  Diagnosis Date  . Anemia     There are no problems to display for this patient.   Past Surgical History:  Procedure Laterality Date  . CESAREAN SECTION       OB History   No obstetric history on file.     Family History  Problem Relation Age of Onset  . Healthy Mother     Social History   Tobacco Use  . Smoking status: Never Smoker  . Smokeless tobacco: Never Used  Substance Use Topics  . Alcohol use: Yes    Comment: occ  . Drug use: Never    Home Medications Prior to Admission medications   Medication Sig Start Date End Date Taking? Authorizing Provider  ferrous sulfate 325 (65 FE) MG tablet Take 1 tablet (325 mg total) by mouth daily. 02/07/20 03/08/20  Virgina Norfolk, DO    Allergies    Patient has no known allergies.  Review of Systems   Review of Systems  All other systems reviewed and are negative.   Physical Exam Updated Vital Signs BP (!) 169/92 (BP Location: Left Arm)   Pulse 91   Temp 98.2 F (36.8 C) (Oral)   Resp 18   Ht 1.6 m (5\' 3" )   Wt 84.1 kg   LMP 01/17/2020   SpO2 100%   BMI 32.84 kg/m   Physical Exam Vitals and nursing note reviewed.  Constitutional:      General: She is not in acute distress.  Appearance: Normal appearance. She is well-developed. She is not toxic-appearing.  HENT:     Head: Normocephalic and atraumatic.  Eyes:     General: Lids are normal.     Conjunctiva/sclera: Conjunctivae normal.     Pupils: Pupils are equal, round, and reactive to light.  Neck:     Thyroid: No thyroid mass.     Trachea: No tracheal deviation.  Cardiovascular:     Rate and Rhythm: Normal rate and regular rhythm.     Heart sounds: Normal heart sounds. No murmur. No gallop.   Pulmonary:     Effort: Pulmonary effort is normal. No respiratory distress.     Breath sounds: Normal breath sounds. No stridor. No decreased breath sounds, wheezing, rhonchi or rales.  Abdominal:     General: Bowel sounds are normal. There is no distension.     Palpations: Abdomen is soft.     Tenderness: There is no abdominal tenderness. There is no rebound.  Musculoskeletal:        General: No tenderness. Normal range of motion.     Cervical back: Normal range of motion and neck supple.  Skin:    General:  Skin is warm and dry.     Findings: No abrasion or rash.  Neurological:     Mental Status: She is alert and oriented to person, place, and time.     GCS: GCS eye subscore is 4. GCS verbal subscore is 5. GCS motor subscore is 6.     Cranial Nerves: No cranial nerve deficit.     Sensory: No sensory deficit.  Psychiatric:        Speech: Speech normal.        Behavior: Behavior normal.     ED Results / Procedures / Treatments   Labs (all labs ordered are listed, but only abnormal results are displayed) Labs Reviewed  COMPREHENSIVE METABOLIC PANEL - Abnormal; Notable for the following components:      Result Value   Glucose, Bld 101 (*)    Total Bilirubin 0.2 (*)    All other components within normal limits  CBC - Abnormal; Notable for the following components:   Hemoglobin 7.2 (*)    HCT 27.4 (*)    MCV 65.7 (*)    MCH 17.3 (*)    MCHC 26.3 (*)    RDW 19.9 (*)    Platelets 403 (*)    All other  components within normal limits  I-STAT BETA HCG BLOOD, ED ([facility], [facility], [facility] ONLY)  TYPE AND SCREEN    EKG None  Radiology DG Chest Portable 1 View  Result Date: 02/07/2020 CLINICAL DATA:  Chest pain. Additional history provided: Patient reports right-sided chest pain beginning 4 days ago. EXAM: PORTABLE CHEST 1 VIEW COMPARISON:  Chest radiograph 10/18/2018 FINDINGS: Heart size within normal limits. There is no evidence of airspace consolidation within the lungs. No evidence of pleural effusion or pneumothorax. No acute bony abnormality. IMPRESSION: No evidence of acute cardiopulmonary abnormality. Electronically Signed   By: Kellie Simmering DO   On: 02/07/2020 18:35    Procedures Procedures (including critical care time)  Medications Ordered in ED Medications - No data to display  ED Course  I have reviewed the triage vital signs and the nursing notes.  Pertinent labs & imaging results that were available during my care of the patient were reviewed by me and considered in my medical decision making (see chart for details).    MDM Rules/Calculators/A&P                      Patient hemoglobin is stable here and unchanged at 7.2.  I instructed her to keep her appointment next week for iron therapy.  Return precautions given Final Clinical Impression(s) / ED Diagnoses Final diagnoses:  None    Rx / DC Orders ED Discharge Orders    None       Lacretia Leigh, MD 02/08/20 1549

## 2020-02-08 NOTE — Discharge Instructions (Addendum)
Continue taking your iron therapy tablets.  Return here if you have new severe vaginal bleeding, feeling like your heart is racing, change in your breathing, or any other problems

## 2020-02-09 LAB — ABO/RH: ABO/RH(D): O POS

## 2020-02-20 DIAGNOSIS — N920 Excessive and frequent menstruation with regular cycle: Secondary | ICD-10-CM | POA: Diagnosis not present

## 2020-02-20 DIAGNOSIS — N938 Other specified abnormal uterine and vaginal bleeding: Secondary | ICD-10-CM | POA: Diagnosis not present

## 2020-02-21 DIAGNOSIS — J3089 Other allergic rhinitis: Secondary | ICD-10-CM | POA: Diagnosis not present

## 2020-02-21 DIAGNOSIS — J3081 Allergic rhinitis due to animal (cat) (dog) hair and dander: Secondary | ICD-10-CM | POA: Diagnosis not present

## 2020-02-21 DIAGNOSIS — J301 Allergic rhinitis due to pollen: Secondary | ICD-10-CM | POA: Diagnosis not present

## 2020-03-18 DIAGNOSIS — J3081 Allergic rhinitis due to animal (cat) (dog) hair and dander: Secondary | ICD-10-CM | POA: Diagnosis not present

## 2020-03-18 DIAGNOSIS — J301 Allergic rhinitis due to pollen: Secondary | ICD-10-CM | POA: Diagnosis not present

## 2020-03-18 DIAGNOSIS — J3089 Other allergic rhinitis: Secondary | ICD-10-CM | POA: Diagnosis not present

## 2020-03-31 DIAGNOSIS — L821 Other seborrheic keratosis: Secondary | ICD-10-CM | POA: Diagnosis not present

## 2020-03-31 DIAGNOSIS — D229 Melanocytic nevi, unspecified: Secondary | ICD-10-CM | POA: Diagnosis not present

## 2020-03-31 DIAGNOSIS — L820 Inflamed seborrheic keratosis: Secondary | ICD-10-CM | POA: Diagnosis not present

## 2020-04-02 DIAGNOSIS — N920 Excessive and frequent menstruation with regular cycle: Secondary | ICD-10-CM | POA: Diagnosis not present

## 2020-04-02 DIAGNOSIS — N938 Other specified abnormal uterine and vaginal bleeding: Secondary | ICD-10-CM | POA: Diagnosis not present

## 2020-04-08 DIAGNOSIS — J3089 Other allergic rhinitis: Secondary | ICD-10-CM | POA: Diagnosis not present

## 2020-04-08 DIAGNOSIS — J301 Allergic rhinitis due to pollen: Secondary | ICD-10-CM | POA: Diagnosis not present

## 2020-04-08 DIAGNOSIS — J3081 Allergic rhinitis due to animal (cat) (dog) hair and dander: Secondary | ICD-10-CM | POA: Diagnosis not present

## 2020-04-16 DIAGNOSIS — Z3042 Encounter for surveillance of injectable contraceptive: Secondary | ICD-10-CM | POA: Diagnosis not present

## 2020-04-16 DIAGNOSIS — N920 Excessive and frequent menstruation with regular cycle: Secondary | ICD-10-CM | POA: Diagnosis not present

## 2020-05-06 DIAGNOSIS — J3089 Other allergic rhinitis: Secondary | ICD-10-CM | POA: Diagnosis not present

## 2020-05-06 DIAGNOSIS — J301 Allergic rhinitis due to pollen: Secondary | ICD-10-CM | POA: Diagnosis not present

## 2020-05-06 DIAGNOSIS — J3081 Allergic rhinitis due to animal (cat) (dog) hair and dander: Secondary | ICD-10-CM | POA: Diagnosis not present

## 2020-05-07 DIAGNOSIS — Z6832 Body mass index (BMI) 32.0-32.9, adult: Secondary | ICD-10-CM | POA: Diagnosis not present

## 2020-05-07 DIAGNOSIS — Z01419 Encounter for gynecological examination (general) (routine) without abnormal findings: Secondary | ICD-10-CM | POA: Diagnosis not present

## 2020-05-27 DIAGNOSIS — J3089 Other allergic rhinitis: Secondary | ICD-10-CM | POA: Diagnosis not present

## 2020-05-27 DIAGNOSIS — J3081 Allergic rhinitis due to animal (cat) (dog) hair and dander: Secondary | ICD-10-CM | POA: Diagnosis not present

## 2020-05-27 DIAGNOSIS — J301 Allergic rhinitis due to pollen: Secondary | ICD-10-CM | POA: Diagnosis not present

## 2020-06-24 DIAGNOSIS — J3089 Other allergic rhinitis: Secondary | ICD-10-CM | POA: Diagnosis not present

## 2020-06-24 DIAGNOSIS — J301 Allergic rhinitis due to pollen: Secondary | ICD-10-CM | POA: Diagnosis not present

## 2020-06-24 DIAGNOSIS — J3081 Allergic rhinitis due to animal (cat) (dog) hair and dander: Secondary | ICD-10-CM | POA: Diagnosis not present

## 2020-07-22 DIAGNOSIS — J3089 Other allergic rhinitis: Secondary | ICD-10-CM | POA: Diagnosis not present

## 2020-07-22 DIAGNOSIS — J301 Allergic rhinitis due to pollen: Secondary | ICD-10-CM | POA: Diagnosis not present

## 2020-07-22 DIAGNOSIS — J3081 Allergic rhinitis due to animal (cat) (dog) hair and dander: Secondary | ICD-10-CM | POA: Diagnosis not present

## 2020-08-12 DIAGNOSIS — Z23 Encounter for immunization: Secondary | ICD-10-CM | POA: Diagnosis not present

## 2020-08-21 DIAGNOSIS — J301 Allergic rhinitis due to pollen: Secondary | ICD-10-CM | POA: Diagnosis not present

## 2020-08-21 DIAGNOSIS — J3081 Allergic rhinitis due to animal (cat) (dog) hair and dander: Secondary | ICD-10-CM | POA: Diagnosis not present

## 2020-08-21 DIAGNOSIS — J3089 Other allergic rhinitis: Secondary | ICD-10-CM | POA: Diagnosis not present

## 2020-09-18 DIAGNOSIS — J3081 Allergic rhinitis due to animal (cat) (dog) hair and dander: Secondary | ICD-10-CM | POA: Diagnosis not present

## 2020-09-18 DIAGNOSIS — J301 Allergic rhinitis due to pollen: Secondary | ICD-10-CM | POA: Diagnosis not present

## 2020-09-18 DIAGNOSIS — J3089 Other allergic rhinitis: Secondary | ICD-10-CM | POA: Diagnosis not present

## 2020-10-15 DIAGNOSIS — D509 Iron deficiency anemia, unspecified: Secondary | ICD-10-CM | POA: Diagnosis not present

## 2020-10-15 DIAGNOSIS — N63 Unspecified lump in unspecified breast: Secondary | ICD-10-CM | POA: Diagnosis not present

## 2020-10-15 DIAGNOSIS — D508 Other iron deficiency anemias: Secondary | ICD-10-CM | POA: Diagnosis not present

## 2020-10-15 DIAGNOSIS — N938 Other specified abnormal uterine and vaginal bleeding: Secondary | ICD-10-CM | POA: Diagnosis not present

## 2020-10-16 DIAGNOSIS — J301 Allergic rhinitis due to pollen: Secondary | ICD-10-CM | POA: Diagnosis not present

## 2020-10-16 DIAGNOSIS — J3089 Other allergic rhinitis: Secondary | ICD-10-CM | POA: Diagnosis not present

## 2020-10-16 DIAGNOSIS — J3081 Allergic rhinitis due to animal (cat) (dog) hair and dander: Secondary | ICD-10-CM | POA: Diagnosis not present

## 2020-10-17 DIAGNOSIS — J3081 Allergic rhinitis due to animal (cat) (dog) hair and dander: Secondary | ICD-10-CM | POA: Diagnosis not present

## 2020-10-17 DIAGNOSIS — J3089 Other allergic rhinitis: Secondary | ICD-10-CM | POA: Diagnosis not present

## 2020-10-17 DIAGNOSIS — J301 Allergic rhinitis due to pollen: Secondary | ICD-10-CM | POA: Diagnosis not present

## 2020-10-28 DIAGNOSIS — J301 Allergic rhinitis due to pollen: Secondary | ICD-10-CM | POA: Diagnosis not present

## 2020-10-28 DIAGNOSIS — J4530 Mild persistent asthma, uncomplicated: Secondary | ICD-10-CM | POA: Diagnosis not present

## 2020-10-28 DIAGNOSIS — J3089 Other allergic rhinitis: Secondary | ICD-10-CM | POA: Diagnosis not present

## 2020-10-28 DIAGNOSIS — J3081 Allergic rhinitis due to animal (cat) (dog) hair and dander: Secondary | ICD-10-CM | POA: Diagnosis not present

## 2020-10-29 DIAGNOSIS — J342 Deviated nasal septum: Secondary | ICD-10-CM | POA: Diagnosis not present

## 2020-10-29 DIAGNOSIS — J309 Allergic rhinitis, unspecified: Secondary | ICD-10-CM | POA: Diagnosis not present

## 2020-10-29 DIAGNOSIS — J0191 Acute recurrent sinusitis, unspecified: Secondary | ICD-10-CM | POA: Diagnosis not present

## 2020-10-29 DIAGNOSIS — H6123 Impacted cerumen, bilateral: Secondary | ICD-10-CM | POA: Diagnosis not present

## 2020-11-10 DIAGNOSIS — J3081 Allergic rhinitis due to animal (cat) (dog) hair and dander: Secondary | ICD-10-CM | POA: Diagnosis not present

## 2020-11-10 DIAGNOSIS — J3089 Other allergic rhinitis: Secondary | ICD-10-CM | POA: Diagnosis not present

## 2020-11-10 DIAGNOSIS — J301 Allergic rhinitis due to pollen: Secondary | ICD-10-CM | POA: Diagnosis not present

## 2020-11-19 DIAGNOSIS — L570 Actinic keratosis: Secondary | ICD-10-CM | POA: Diagnosis not present

## 2020-11-19 DIAGNOSIS — B078 Other viral warts: Secondary | ICD-10-CM | POA: Diagnosis not present

## 2020-11-21 DIAGNOSIS — J3089 Other allergic rhinitis: Secondary | ICD-10-CM | POA: Diagnosis not present

## 2020-11-21 DIAGNOSIS — J3081 Allergic rhinitis due to animal (cat) (dog) hair and dander: Secondary | ICD-10-CM | POA: Diagnosis not present

## 2020-12-09 DIAGNOSIS — Z1231 Encounter for screening mammogram for malignant neoplasm of breast: Secondary | ICD-10-CM | POA: Diagnosis not present

## 2020-12-11 DIAGNOSIS — J3081 Allergic rhinitis due to animal (cat) (dog) hair and dander: Secondary | ICD-10-CM | POA: Diagnosis not present

## 2020-12-11 DIAGNOSIS — J301 Allergic rhinitis due to pollen: Secondary | ICD-10-CM | POA: Diagnosis not present

## 2020-12-11 DIAGNOSIS — J3089 Other allergic rhinitis: Secondary | ICD-10-CM | POA: Diagnosis not present

## 2020-12-17 DIAGNOSIS — J3089 Other allergic rhinitis: Secondary | ICD-10-CM | POA: Diagnosis not present

## 2020-12-17 DIAGNOSIS — J3081 Allergic rhinitis due to animal (cat) (dog) hair and dander: Secondary | ICD-10-CM | POA: Diagnosis not present

## 2020-12-17 DIAGNOSIS — J301 Allergic rhinitis due to pollen: Secondary | ICD-10-CM | POA: Diagnosis not present

## 2021-01-01 DIAGNOSIS — J3081 Allergic rhinitis due to animal (cat) (dog) hair and dander: Secondary | ICD-10-CM | POA: Diagnosis not present

## 2021-01-01 DIAGNOSIS — J301 Allergic rhinitis due to pollen: Secondary | ICD-10-CM | POA: Diagnosis not present

## 2021-01-01 DIAGNOSIS — J3089 Other allergic rhinitis: Secondary | ICD-10-CM | POA: Diagnosis not present

## 2021-01-08 DIAGNOSIS — J3089 Other allergic rhinitis: Secondary | ICD-10-CM | POA: Diagnosis not present

## 2021-01-08 DIAGNOSIS — J3081 Allergic rhinitis due to animal (cat) (dog) hair and dander: Secondary | ICD-10-CM | POA: Diagnosis not present

## 2021-01-08 DIAGNOSIS — J301 Allergic rhinitis due to pollen: Secondary | ICD-10-CM | POA: Diagnosis not present

## 2021-01-15 DIAGNOSIS — J3081 Allergic rhinitis due to animal (cat) (dog) hair and dander: Secondary | ICD-10-CM | POA: Diagnosis not present

## 2021-01-15 DIAGNOSIS — J3089 Other allergic rhinitis: Secondary | ICD-10-CM | POA: Diagnosis not present

## 2021-01-15 DIAGNOSIS — J301 Allergic rhinitis due to pollen: Secondary | ICD-10-CM | POA: Diagnosis not present

## 2021-01-22 DIAGNOSIS — J3081 Allergic rhinitis due to animal (cat) (dog) hair and dander: Secondary | ICD-10-CM | POA: Diagnosis not present

## 2021-01-22 DIAGNOSIS — J3089 Other allergic rhinitis: Secondary | ICD-10-CM | POA: Diagnosis not present

## 2021-01-22 DIAGNOSIS — J301 Allergic rhinitis due to pollen: Secondary | ICD-10-CM | POA: Diagnosis not present

## 2021-01-29 DIAGNOSIS — J3081 Allergic rhinitis due to animal (cat) (dog) hair and dander: Secondary | ICD-10-CM | POA: Diagnosis not present

## 2021-01-29 DIAGNOSIS — J3089 Other allergic rhinitis: Secondary | ICD-10-CM | POA: Diagnosis not present

## 2021-01-29 DIAGNOSIS — J301 Allergic rhinitis due to pollen: Secondary | ICD-10-CM | POA: Diagnosis not present

## 2021-02-05 DIAGNOSIS — J3081 Allergic rhinitis due to animal (cat) (dog) hair and dander: Secondary | ICD-10-CM | POA: Diagnosis not present

## 2021-02-05 DIAGNOSIS — J3089 Other allergic rhinitis: Secondary | ICD-10-CM | POA: Diagnosis not present

## 2021-02-05 DIAGNOSIS — J301 Allergic rhinitis due to pollen: Secondary | ICD-10-CM | POA: Diagnosis not present

## 2021-02-12 DIAGNOSIS — J301 Allergic rhinitis due to pollen: Secondary | ICD-10-CM | POA: Diagnosis not present

## 2021-02-12 DIAGNOSIS — J3089 Other allergic rhinitis: Secondary | ICD-10-CM | POA: Diagnosis not present

## 2021-02-12 DIAGNOSIS — J3081 Allergic rhinitis due to animal (cat) (dog) hair and dander: Secondary | ICD-10-CM | POA: Diagnosis not present

## 2021-02-19 DIAGNOSIS — J3081 Allergic rhinitis due to animal (cat) (dog) hair and dander: Secondary | ICD-10-CM | POA: Diagnosis not present

## 2021-02-19 DIAGNOSIS — J3089 Other allergic rhinitis: Secondary | ICD-10-CM | POA: Diagnosis not present

## 2021-02-19 DIAGNOSIS — J301 Allergic rhinitis due to pollen: Secondary | ICD-10-CM | POA: Diagnosis not present

## 2021-02-26 DIAGNOSIS — J3089 Other allergic rhinitis: Secondary | ICD-10-CM | POA: Diagnosis not present

## 2021-02-26 DIAGNOSIS — J3081 Allergic rhinitis due to animal (cat) (dog) hair and dander: Secondary | ICD-10-CM | POA: Diagnosis not present

## 2021-02-26 DIAGNOSIS — J301 Allergic rhinitis due to pollen: Secondary | ICD-10-CM | POA: Diagnosis not present

## 2021-03-12 DIAGNOSIS — J3089 Other allergic rhinitis: Secondary | ICD-10-CM | POA: Diagnosis not present

## 2021-03-12 DIAGNOSIS — J301 Allergic rhinitis due to pollen: Secondary | ICD-10-CM | POA: Diagnosis not present

## 2021-03-12 DIAGNOSIS — J3081 Allergic rhinitis due to animal (cat) (dog) hair and dander: Secondary | ICD-10-CM | POA: Diagnosis not present

## 2021-03-19 DIAGNOSIS — J3089 Other allergic rhinitis: Secondary | ICD-10-CM | POA: Diagnosis not present

## 2021-03-19 DIAGNOSIS — J301 Allergic rhinitis due to pollen: Secondary | ICD-10-CM | POA: Diagnosis not present

## 2021-03-19 DIAGNOSIS — J3081 Allergic rhinitis due to animal (cat) (dog) hair and dander: Secondary | ICD-10-CM | POA: Diagnosis not present

## 2021-03-26 DIAGNOSIS — J3089 Other allergic rhinitis: Secondary | ICD-10-CM | POA: Diagnosis not present

## 2021-03-26 DIAGNOSIS — J3081 Allergic rhinitis due to animal (cat) (dog) hair and dander: Secondary | ICD-10-CM | POA: Diagnosis not present

## 2021-03-26 DIAGNOSIS — J301 Allergic rhinitis due to pollen: Secondary | ICD-10-CM | POA: Diagnosis not present

## 2021-04-09 DIAGNOSIS — J3081 Allergic rhinitis due to animal (cat) (dog) hair and dander: Secondary | ICD-10-CM | POA: Diagnosis not present

## 2021-04-09 DIAGNOSIS — J301 Allergic rhinitis due to pollen: Secondary | ICD-10-CM | POA: Diagnosis not present

## 2021-04-09 DIAGNOSIS — J3089 Other allergic rhinitis: Secondary | ICD-10-CM | POA: Diagnosis not present

## 2021-04-16 DIAGNOSIS — J3081 Allergic rhinitis due to animal (cat) (dog) hair and dander: Secondary | ICD-10-CM | POA: Diagnosis not present

## 2021-04-16 DIAGNOSIS — J301 Allergic rhinitis due to pollen: Secondary | ICD-10-CM | POA: Diagnosis not present

## 2021-04-16 DIAGNOSIS — J3089 Other allergic rhinitis: Secondary | ICD-10-CM | POA: Diagnosis not present

## 2021-04-23 DIAGNOSIS — J301 Allergic rhinitis due to pollen: Secondary | ICD-10-CM | POA: Diagnosis not present

## 2021-04-23 DIAGNOSIS — J3081 Allergic rhinitis due to animal (cat) (dog) hair and dander: Secondary | ICD-10-CM | POA: Diagnosis not present

## 2021-04-23 DIAGNOSIS — J3089 Other allergic rhinitis: Secondary | ICD-10-CM | POA: Diagnosis not present

## 2021-04-30 DIAGNOSIS — J3081 Allergic rhinitis due to animal (cat) (dog) hair and dander: Secondary | ICD-10-CM | POA: Diagnosis not present

## 2021-04-30 DIAGNOSIS — J301 Allergic rhinitis due to pollen: Secondary | ICD-10-CM | POA: Diagnosis not present

## 2021-04-30 DIAGNOSIS — J3089 Other allergic rhinitis: Secondary | ICD-10-CM | POA: Diagnosis not present

## 2021-05-04 DIAGNOSIS — J3081 Allergic rhinitis due to animal (cat) (dog) hair and dander: Secondary | ICD-10-CM | POA: Diagnosis not present

## 2021-05-04 DIAGNOSIS — J3089 Other allergic rhinitis: Secondary | ICD-10-CM | POA: Diagnosis not present

## 2021-05-04 DIAGNOSIS — J301 Allergic rhinitis due to pollen: Secondary | ICD-10-CM | POA: Diagnosis not present

## 2021-05-07 DIAGNOSIS — J301 Allergic rhinitis due to pollen: Secondary | ICD-10-CM | POA: Diagnosis not present

## 2021-05-07 DIAGNOSIS — J3081 Allergic rhinitis due to animal (cat) (dog) hair and dander: Secondary | ICD-10-CM | POA: Diagnosis not present

## 2021-05-07 DIAGNOSIS — J3089 Other allergic rhinitis: Secondary | ICD-10-CM | POA: Diagnosis not present

## 2021-05-14 DIAGNOSIS — J3089 Other allergic rhinitis: Secondary | ICD-10-CM | POA: Diagnosis not present

## 2021-05-14 DIAGNOSIS — J301 Allergic rhinitis due to pollen: Secondary | ICD-10-CM | POA: Diagnosis not present

## 2021-05-14 DIAGNOSIS — J3081 Allergic rhinitis due to animal (cat) (dog) hair and dander: Secondary | ICD-10-CM | POA: Diagnosis not present

## 2021-05-21 DIAGNOSIS — J3081 Allergic rhinitis due to animal (cat) (dog) hair and dander: Secondary | ICD-10-CM | POA: Diagnosis not present

## 2021-05-21 DIAGNOSIS — J301 Allergic rhinitis due to pollen: Secondary | ICD-10-CM | POA: Diagnosis not present

## 2021-05-21 DIAGNOSIS — J3089 Other allergic rhinitis: Secondary | ICD-10-CM | POA: Diagnosis not present

## 2021-05-27 DIAGNOSIS — J3089 Other allergic rhinitis: Secondary | ICD-10-CM | POA: Diagnosis not present

## 2021-05-27 DIAGNOSIS — J301 Allergic rhinitis due to pollen: Secondary | ICD-10-CM | POA: Diagnosis not present

## 2021-05-27 DIAGNOSIS — J3081 Allergic rhinitis due to animal (cat) (dog) hair and dander: Secondary | ICD-10-CM | POA: Diagnosis not present

## 2021-06-09 DIAGNOSIS — J3089 Other allergic rhinitis: Secondary | ICD-10-CM | POA: Diagnosis not present

## 2021-06-09 DIAGNOSIS — J301 Allergic rhinitis due to pollen: Secondary | ICD-10-CM | POA: Diagnosis not present

## 2021-06-09 DIAGNOSIS — J3081 Allergic rhinitis due to animal (cat) (dog) hair and dander: Secondary | ICD-10-CM | POA: Diagnosis not present

## 2021-06-15 DIAGNOSIS — J3089 Other allergic rhinitis: Secondary | ICD-10-CM | POA: Diagnosis not present

## 2021-06-15 DIAGNOSIS — J3081 Allergic rhinitis due to animal (cat) (dog) hair and dander: Secondary | ICD-10-CM | POA: Diagnosis not present

## 2021-06-15 DIAGNOSIS — J301 Allergic rhinitis due to pollen: Secondary | ICD-10-CM | POA: Diagnosis not present

## 2021-06-23 DIAGNOSIS — J3089 Other allergic rhinitis: Secondary | ICD-10-CM | POA: Diagnosis not present

## 2021-06-23 DIAGNOSIS — J3081 Allergic rhinitis due to animal (cat) (dog) hair and dander: Secondary | ICD-10-CM | POA: Diagnosis not present

## 2021-06-23 DIAGNOSIS — J301 Allergic rhinitis due to pollen: Secondary | ICD-10-CM | POA: Diagnosis not present

## 2021-06-29 DIAGNOSIS — J3081 Allergic rhinitis due to animal (cat) (dog) hair and dander: Secondary | ICD-10-CM | POA: Diagnosis not present

## 2021-06-29 DIAGNOSIS — J301 Allergic rhinitis due to pollen: Secondary | ICD-10-CM | POA: Diagnosis not present

## 2021-06-29 DIAGNOSIS — J3089 Other allergic rhinitis: Secondary | ICD-10-CM | POA: Diagnosis not present

## 2021-07-06 DIAGNOSIS — J3081 Allergic rhinitis due to animal (cat) (dog) hair and dander: Secondary | ICD-10-CM | POA: Diagnosis not present

## 2021-07-06 DIAGNOSIS — J301 Allergic rhinitis due to pollen: Secondary | ICD-10-CM | POA: Diagnosis not present

## 2021-07-06 DIAGNOSIS — J3089 Other allergic rhinitis: Secondary | ICD-10-CM | POA: Diagnosis not present

## 2021-07-08 DIAGNOSIS — Z85828 Personal history of other malignant neoplasm of skin: Secondary | ICD-10-CM | POA: Diagnosis not present

## 2021-07-08 DIAGNOSIS — D229 Melanocytic nevi, unspecified: Secondary | ICD-10-CM | POA: Diagnosis not present

## 2021-07-08 DIAGNOSIS — L570 Actinic keratosis: Secondary | ICD-10-CM | POA: Diagnosis not present

## 2021-07-08 DIAGNOSIS — L821 Other seborrheic keratosis: Secondary | ICD-10-CM | POA: Diagnosis not present

## 2021-07-14 DIAGNOSIS — J3081 Allergic rhinitis due to animal (cat) (dog) hair and dander: Secondary | ICD-10-CM | POA: Diagnosis not present

## 2021-07-14 DIAGNOSIS — J3089 Other allergic rhinitis: Secondary | ICD-10-CM | POA: Diagnosis not present

## 2021-07-14 DIAGNOSIS — J301 Allergic rhinitis due to pollen: Secondary | ICD-10-CM | POA: Diagnosis not present

## 2021-07-21 DIAGNOSIS — J301 Allergic rhinitis due to pollen: Secondary | ICD-10-CM | POA: Diagnosis not present

## 2021-07-21 DIAGNOSIS — J3081 Allergic rhinitis due to animal (cat) (dog) hair and dander: Secondary | ICD-10-CM | POA: Diagnosis not present

## 2021-07-21 DIAGNOSIS — J3089 Other allergic rhinitis: Secondary | ICD-10-CM | POA: Diagnosis not present

## 2021-07-29 DIAGNOSIS — J3089 Other allergic rhinitis: Secondary | ICD-10-CM | POA: Diagnosis not present

## 2021-07-29 DIAGNOSIS — J3081 Allergic rhinitis due to animal (cat) (dog) hair and dander: Secondary | ICD-10-CM | POA: Diagnosis not present

## 2021-07-29 DIAGNOSIS — J301 Allergic rhinitis due to pollen: Secondary | ICD-10-CM | POA: Diagnosis not present

## 2021-07-31 DIAGNOSIS — J3089 Other allergic rhinitis: Secondary | ICD-10-CM | POA: Diagnosis not present

## 2021-07-31 DIAGNOSIS — J3081 Allergic rhinitis due to animal (cat) (dog) hair and dander: Secondary | ICD-10-CM | POA: Diagnosis not present

## 2021-08-12 DIAGNOSIS — J3081 Allergic rhinitis due to animal (cat) (dog) hair and dander: Secondary | ICD-10-CM | POA: Diagnosis not present

## 2021-08-12 DIAGNOSIS — J3089 Other allergic rhinitis: Secondary | ICD-10-CM | POA: Diagnosis not present

## 2021-08-12 DIAGNOSIS — J301 Allergic rhinitis due to pollen: Secondary | ICD-10-CM | POA: Diagnosis not present

## 2021-08-14 DIAGNOSIS — Z23 Encounter for immunization: Secondary | ICD-10-CM | POA: Diagnosis not present

## 2021-08-16 DIAGNOSIS — R109 Unspecified abdominal pain: Secondary | ICD-10-CM | POA: Diagnosis not present

## 2021-08-16 DIAGNOSIS — N200 Calculus of kidney: Secondary | ICD-10-CM | POA: Diagnosis not present

## 2021-08-16 DIAGNOSIS — D259 Leiomyoma of uterus, unspecified: Secondary | ICD-10-CM | POA: Diagnosis not present

## 2021-08-16 DIAGNOSIS — N201 Calculus of ureter: Secondary | ICD-10-CM | POA: Diagnosis not present

## 2021-08-16 DIAGNOSIS — N132 Hydronephrosis with renal and ureteral calculous obstruction: Secondary | ICD-10-CM | POA: Diagnosis not present

## 2021-08-16 DIAGNOSIS — N1330 Unspecified hydronephrosis: Secondary | ICD-10-CM | POA: Diagnosis not present

## 2021-08-16 DIAGNOSIS — R112 Nausea with vomiting, unspecified: Secondary | ICD-10-CM | POA: Diagnosis not present

## 2021-08-24 DIAGNOSIS — R350 Frequency of micturition: Secondary | ICD-10-CM | POA: Diagnosis not present

## 2021-08-24 DIAGNOSIS — R109 Unspecified abdominal pain: Secondary | ICD-10-CM | POA: Diagnosis not present

## 2021-08-25 DIAGNOSIS — N200 Calculus of kidney: Secondary | ICD-10-CM | POA: Diagnosis not present

## 2021-08-25 DIAGNOSIS — N201 Calculus of ureter: Secondary | ICD-10-CM | POA: Diagnosis not present

## 2021-08-27 DIAGNOSIS — J3081 Allergic rhinitis due to animal (cat) (dog) hair and dander: Secondary | ICD-10-CM | POA: Diagnosis not present

## 2021-08-27 DIAGNOSIS — J3089 Other allergic rhinitis: Secondary | ICD-10-CM | POA: Diagnosis not present

## 2021-08-27 DIAGNOSIS — J301 Allergic rhinitis due to pollen: Secondary | ICD-10-CM | POA: Diagnosis not present

## 2021-08-28 DIAGNOSIS — J301 Allergic rhinitis due to pollen: Secondary | ICD-10-CM | POA: Diagnosis not present

## 2021-08-28 DIAGNOSIS — J3081 Allergic rhinitis due to animal (cat) (dog) hair and dander: Secondary | ICD-10-CM | POA: Diagnosis not present

## 2021-08-28 DIAGNOSIS — J3089 Other allergic rhinitis: Secondary | ICD-10-CM | POA: Diagnosis not present

## 2021-09-09 DIAGNOSIS — J3089 Other allergic rhinitis: Secondary | ICD-10-CM | POA: Diagnosis not present

## 2021-09-09 DIAGNOSIS — J301 Allergic rhinitis due to pollen: Secondary | ICD-10-CM | POA: Diagnosis not present

## 2021-09-09 DIAGNOSIS — J3081 Allergic rhinitis due to animal (cat) (dog) hair and dander: Secondary | ICD-10-CM | POA: Diagnosis not present

## 2021-09-22 DIAGNOSIS — D259 Leiomyoma of uterus, unspecified: Secondary | ICD-10-CM | POA: Diagnosis not present

## 2021-09-23 DIAGNOSIS — J3089 Other allergic rhinitis: Secondary | ICD-10-CM | POA: Diagnosis not present

## 2021-09-23 DIAGNOSIS — J301 Allergic rhinitis due to pollen: Secondary | ICD-10-CM | POA: Diagnosis not present

## 2021-09-23 DIAGNOSIS — J3081 Allergic rhinitis due to animal (cat) (dog) hair and dander: Secondary | ICD-10-CM | POA: Diagnosis not present

## 2021-09-23 DIAGNOSIS — N938 Other specified abnormal uterine and vaginal bleeding: Secondary | ICD-10-CM | POA: Diagnosis not present

## 2021-09-23 DIAGNOSIS — Z6831 Body mass index (BMI) 31.0-31.9, adult: Secondary | ICD-10-CM | POA: Diagnosis not present

## 2021-09-23 DIAGNOSIS — Z01411 Encounter for gynecological examination (general) (routine) with abnormal findings: Secondary | ICD-10-CM | POA: Diagnosis not present

## 2021-10-14 DIAGNOSIS — J3081 Allergic rhinitis due to animal (cat) (dog) hair and dander: Secondary | ICD-10-CM | POA: Diagnosis not present

## 2021-10-14 DIAGNOSIS — J3089 Other allergic rhinitis: Secondary | ICD-10-CM | POA: Diagnosis not present

## 2021-10-14 DIAGNOSIS — J301 Allergic rhinitis due to pollen: Secondary | ICD-10-CM | POA: Diagnosis not present

## 2021-10-28 DIAGNOSIS — J3081 Allergic rhinitis due to animal (cat) (dog) hair and dander: Secondary | ICD-10-CM | POA: Diagnosis not present

## 2021-10-28 DIAGNOSIS — J3089 Other allergic rhinitis: Secondary | ICD-10-CM | POA: Diagnosis not present

## 2021-10-28 DIAGNOSIS — J301 Allergic rhinitis due to pollen: Secondary | ICD-10-CM | POA: Diagnosis not present

## 2021-11-12 DIAGNOSIS — J301 Allergic rhinitis due to pollen: Secondary | ICD-10-CM | POA: Diagnosis not present

## 2021-11-12 DIAGNOSIS — J3081 Allergic rhinitis due to animal (cat) (dog) hair and dander: Secondary | ICD-10-CM | POA: Diagnosis not present

## 2021-11-12 DIAGNOSIS — J3089 Other allergic rhinitis: Secondary | ICD-10-CM | POA: Diagnosis not present

## 2021-12-02 DIAGNOSIS — J3089 Other allergic rhinitis: Secondary | ICD-10-CM | POA: Diagnosis not present

## 2021-12-02 DIAGNOSIS — J301 Allergic rhinitis due to pollen: Secondary | ICD-10-CM | POA: Diagnosis not present

## 2021-12-02 DIAGNOSIS — J3081 Allergic rhinitis due to animal (cat) (dog) hair and dander: Secondary | ICD-10-CM | POA: Diagnosis not present

## 2021-12-08 IMAGING — DX DG CHEST 1V PORT
1 series · 1 of 1 positions shown · non-contrast
Comparison: Chest radiograph 10/18/2018

CLINICAL DATA: Chest pain. Additional history provided: Patient
reports right-sided chest pain beginning 4 days ago.

EXAM:
PORTABLE CHEST 1 VIEW

[chest ap]
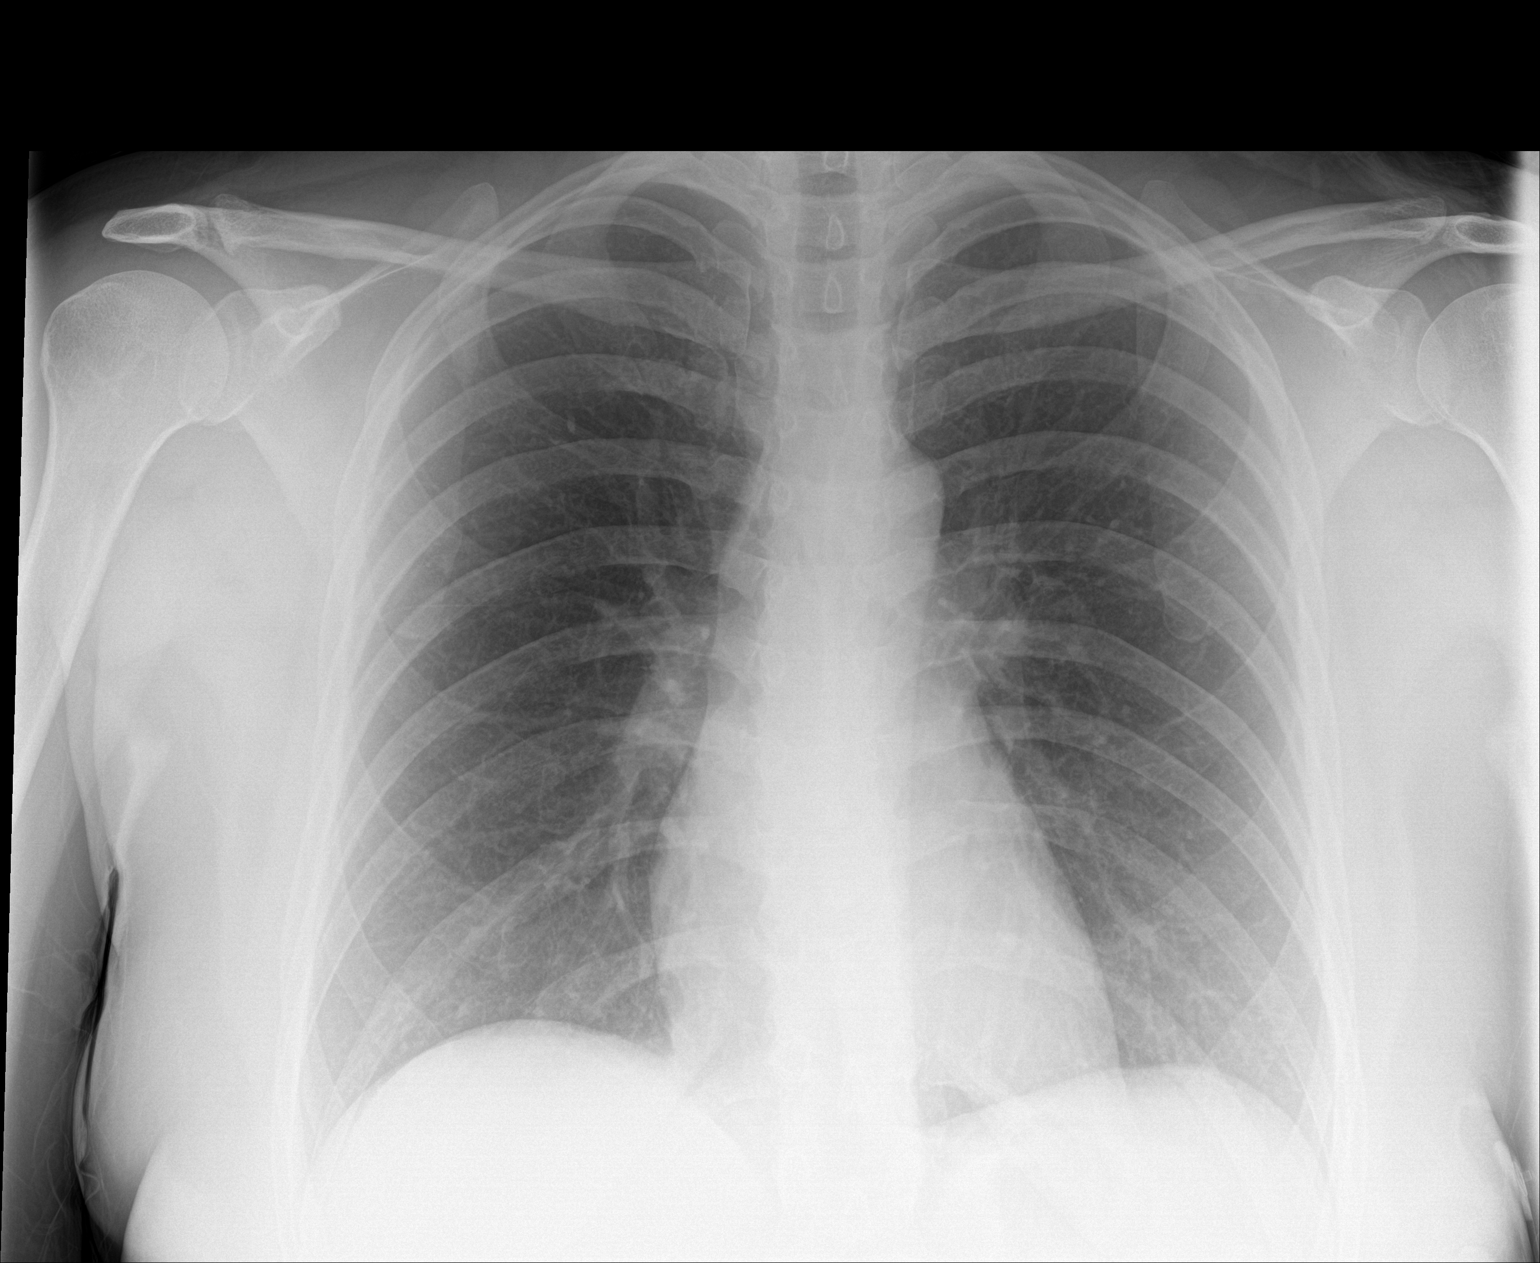

[1 of 1 positions shown; findings below may reference images not displayed]

FINDINGS: Heart size within normal limits.

There is no evidence of airspace consolidation within the lungs.

No evidence of pleural effusion or pneumothorax.

No acute bony abnormality.
IMPRESSION: No evidence of acute cardiopulmonary abnormality.

## 2021-12-16 DIAGNOSIS — J301 Allergic rhinitis due to pollen: Secondary | ICD-10-CM | POA: Diagnosis not present

## 2021-12-16 DIAGNOSIS — J3081 Allergic rhinitis due to animal (cat) (dog) hair and dander: Secondary | ICD-10-CM | POA: Diagnosis not present

## 2021-12-16 DIAGNOSIS — J3089 Other allergic rhinitis: Secondary | ICD-10-CM | POA: Diagnosis not present

## 2021-12-28 DIAGNOSIS — Z1231 Encounter for screening mammogram for malignant neoplasm of breast: Secondary | ICD-10-CM | POA: Diagnosis not present

## 2021-12-30 DIAGNOSIS — J3081 Allergic rhinitis due to animal (cat) (dog) hair and dander: Secondary | ICD-10-CM | POA: Diagnosis not present

## 2021-12-30 DIAGNOSIS — J301 Allergic rhinitis due to pollen: Secondary | ICD-10-CM | POA: Diagnosis not present

## 2021-12-30 DIAGNOSIS — J3089 Other allergic rhinitis: Secondary | ICD-10-CM | POA: Diagnosis not present

## 2022-01-13 DIAGNOSIS — J3089 Other allergic rhinitis: Secondary | ICD-10-CM | POA: Diagnosis not present

## 2022-01-13 DIAGNOSIS — J3081 Allergic rhinitis due to animal (cat) (dog) hair and dander: Secondary | ICD-10-CM | POA: Diagnosis not present

## 2022-01-13 DIAGNOSIS — J301 Allergic rhinitis due to pollen: Secondary | ICD-10-CM | POA: Diagnosis not present

## 2022-01-27 DIAGNOSIS — J3081 Allergic rhinitis due to animal (cat) (dog) hair and dander: Secondary | ICD-10-CM | POA: Diagnosis not present

## 2022-01-27 DIAGNOSIS — J3089 Other allergic rhinitis: Secondary | ICD-10-CM | POA: Diagnosis not present

## 2022-01-27 DIAGNOSIS — J301 Allergic rhinitis due to pollen: Secondary | ICD-10-CM | POA: Diagnosis not present

## 2022-02-10 DIAGNOSIS — J3081 Allergic rhinitis due to animal (cat) (dog) hair and dander: Secondary | ICD-10-CM | POA: Diagnosis not present

## 2022-02-10 DIAGNOSIS — J3089 Other allergic rhinitis: Secondary | ICD-10-CM | POA: Diagnosis not present

## 2022-02-10 DIAGNOSIS — J301 Allergic rhinitis due to pollen: Secondary | ICD-10-CM | POA: Diagnosis not present

## 2022-02-17 DIAGNOSIS — J3089 Other allergic rhinitis: Secondary | ICD-10-CM | POA: Diagnosis not present

## 2022-02-17 DIAGNOSIS — J301 Allergic rhinitis due to pollen: Secondary | ICD-10-CM | POA: Diagnosis not present

## 2022-02-17 DIAGNOSIS — J3081 Allergic rhinitis due to animal (cat) (dog) hair and dander: Secondary | ICD-10-CM | POA: Diagnosis not present

## 2022-03-03 DIAGNOSIS — J301 Allergic rhinitis due to pollen: Secondary | ICD-10-CM | POA: Diagnosis not present

## 2022-03-03 DIAGNOSIS — J3089 Other allergic rhinitis: Secondary | ICD-10-CM | POA: Diagnosis not present

## 2022-03-03 DIAGNOSIS — J3081 Allergic rhinitis due to animal (cat) (dog) hair and dander: Secondary | ICD-10-CM | POA: Diagnosis not present

## 2022-03-17 DIAGNOSIS — J3089 Other allergic rhinitis: Secondary | ICD-10-CM | POA: Diagnosis not present

## 2022-03-17 DIAGNOSIS — J301 Allergic rhinitis due to pollen: Secondary | ICD-10-CM | POA: Diagnosis not present

## 2022-03-17 DIAGNOSIS — J3081 Allergic rhinitis due to animal (cat) (dog) hair and dander: Secondary | ICD-10-CM | POA: Diagnosis not present

## 2022-03-31 DIAGNOSIS — J301 Allergic rhinitis due to pollen: Secondary | ICD-10-CM | POA: Diagnosis not present

## 2022-03-31 DIAGNOSIS — J3081 Allergic rhinitis due to animal (cat) (dog) hair and dander: Secondary | ICD-10-CM | POA: Diagnosis not present

## 2022-03-31 DIAGNOSIS — J3089 Other allergic rhinitis: Secondary | ICD-10-CM | POA: Diagnosis not present

## 2022-04-14 DIAGNOSIS — J3081 Allergic rhinitis due to animal (cat) (dog) hair and dander: Secondary | ICD-10-CM | POA: Diagnosis not present

## 2022-04-14 DIAGNOSIS — J3089 Other allergic rhinitis: Secondary | ICD-10-CM | POA: Diagnosis not present

## 2022-04-14 DIAGNOSIS — J301 Allergic rhinitis due to pollen: Secondary | ICD-10-CM | POA: Diagnosis not present

## 2022-04-28 DIAGNOSIS — J301 Allergic rhinitis due to pollen: Secondary | ICD-10-CM | POA: Diagnosis not present

## 2022-04-28 DIAGNOSIS — J3081 Allergic rhinitis due to animal (cat) (dog) hair and dander: Secondary | ICD-10-CM | POA: Diagnosis not present

## 2022-04-28 DIAGNOSIS — J3089 Other allergic rhinitis: Secondary | ICD-10-CM | POA: Diagnosis not present

## 2022-05-13 DIAGNOSIS — J3089 Other allergic rhinitis: Secondary | ICD-10-CM | POA: Diagnosis not present

## 2022-05-13 DIAGNOSIS — J301 Allergic rhinitis due to pollen: Secondary | ICD-10-CM | POA: Diagnosis not present

## 2022-05-13 DIAGNOSIS — J3081 Allergic rhinitis due to animal (cat) (dog) hair and dander: Secondary | ICD-10-CM | POA: Diagnosis not present

## 2022-05-24 DIAGNOSIS — Z0000 Encounter for general adult medical examination without abnormal findings: Secondary | ICD-10-CM | POA: Diagnosis not present

## 2022-05-26 DIAGNOSIS — Z13220 Encounter for screening for lipoid disorders: Secondary | ICD-10-CM | POA: Diagnosis not present

## 2022-05-26 DIAGNOSIS — Z0000 Encounter for general adult medical examination without abnormal findings: Secondary | ICD-10-CM | POA: Diagnosis not present

## 2022-05-26 DIAGNOSIS — J3089 Other allergic rhinitis: Secondary | ICD-10-CM | POA: Diagnosis not present

## 2022-05-26 DIAGNOSIS — J3081 Allergic rhinitis due to animal (cat) (dog) hair and dander: Secondary | ICD-10-CM | POA: Diagnosis not present

## 2022-05-26 DIAGNOSIS — D500 Iron deficiency anemia secondary to blood loss (chronic): Secondary | ICD-10-CM | POA: Diagnosis not present

## 2022-05-26 DIAGNOSIS — J301 Allergic rhinitis due to pollen: Secondary | ICD-10-CM | POA: Diagnosis not present

## 2022-06-09 DIAGNOSIS — J3089 Other allergic rhinitis: Secondary | ICD-10-CM | POA: Diagnosis not present

## 2022-06-09 DIAGNOSIS — J301 Allergic rhinitis due to pollen: Secondary | ICD-10-CM | POA: Diagnosis not present

## 2022-06-09 DIAGNOSIS — J3081 Allergic rhinitis due to animal (cat) (dog) hair and dander: Secondary | ICD-10-CM | POA: Diagnosis not present

## 2022-06-24 DIAGNOSIS — J301 Allergic rhinitis due to pollen: Secondary | ICD-10-CM | POA: Diagnosis not present

## 2022-06-24 DIAGNOSIS — J3089 Other allergic rhinitis: Secondary | ICD-10-CM | POA: Diagnosis not present

## 2022-06-24 DIAGNOSIS — J3081 Allergic rhinitis due to animal (cat) (dog) hair and dander: Secondary | ICD-10-CM | POA: Diagnosis not present

## 2022-07-06 DIAGNOSIS — J301 Allergic rhinitis due to pollen: Secondary | ICD-10-CM | POA: Diagnosis not present

## 2022-07-06 DIAGNOSIS — J3089 Other allergic rhinitis: Secondary | ICD-10-CM | POA: Diagnosis not present

## 2022-07-06 DIAGNOSIS — J3081 Allergic rhinitis due to animal (cat) (dog) hair and dander: Secondary | ICD-10-CM | POA: Diagnosis not present

## 2022-07-07 NOTE — Progress Notes (Unsigned)
New Patient Note  RE: Kathy HeckCher R Shields MRN: [id number] DOB: 15-Dec-1975 Date of Office Visit: 07/08/2022  Consult requested by: Mitzi Hansenaniel, Janice, NP Primary care provider: Koren ShiverMasneri, Shannon M, DO  Chief Complaint: Allergic Rhinitis  (Seasonal allergies: itchy eyes, runny nose congestion ) and Allergy Testing (Red pepper allergy, bananas, avocados )  History of Present Illness: I had the pleasure of seeing Kathy Shields for initial evaluation at the [facility] on 07/08/2022. She is a 46 y.o. female, who is self-referred for the evaluation of allergic rhinitis and food allergy.  Patient was seen by [facility] in the past for allergic rhinitis and food allergies.  Rhinitis:  She reports symptoms of nasal congestion, rhinorrhea, sneezing, coughing, itchy eyes. Symptoms have been going on for 40+ years. The symptoms are present all year around with worsening from spring through fall. Other triggers include exposure to cats, dog. Anosmia: no. Headache: sometimes. She has used OTC antihistamines, Flonase, pataday eye drops prn with fair improvement in symptoms. Sinus infections: 1 per year. Previous work up includes: last skin testing was 2 years ago which showed multiple positives per patient report - similar to 2015 skin testing but no cockroach.  Awaiting requested records.  2015 skin testing was positive to trees, grass, weed, ragweed, dust mites, cockroach, dog, cat, mold.  Patient was on AIT from age [age].  Restarted AIT from age [age]s and still receiving them - 3 injections. Still having localized reactions.  AIT does seem to be helping allergic symptoms.   Previous ENT evaluation: yes for cerumen impaction Previous sinus imaging: no. History of nasal polyps: no. Last eye exam: few years ago. History of reflux: denies.  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 Bananas caused perioral itching. Tolerates banana bread with no issues.  Red  peppers cause abdominal pains. Avocados sometimes cause abdominal pains but sometimes she can tolerate it with no issues.   She does not have access to epinephrine autoinjector.  Past work up includes: none. Dietary History: patient has been eating other foods including milk, eggs, peanut, treenuts, sesame, shellfish, fish, soy, wheat, meats, fruits and vegetables.  She reports reading labels and avoiding fresh bananas, red peppers in diet completely.   Assessment and Plan: Kathy Shields is a 46 y.o. female with: Other allergic rhinitis Perennial rhino conjunctivitis symptoms for 40+ years. AIT from age [age] and then again from 3620s to now with some benefit. 2015 skin testing positive to trees, grass, weed, ragweed, dust mites, cockroach, dog, cat, mold. Most recent skin testing results not available for review. Requesting records. Today's skin testing showed: Positive to grass, weed, ragweed, trees, mold, dog, horse, dust mites, cockroaches. Borderline to tobacco.  Start environmental control measures as below. Use over the counter antihistamines such as Zyrtec (cetirizine), Claritin (loratadine), Allegra (fexofenadine), or Xyzal (levocetirizine) daily as needed. May take twice a day during allergy flares. May switch antihistamines every few months. Start Ryaltris (olopatadine + mometasone nasal spray combination) 1-2 sprays per nostril twice a day. Sample given. This replaces your other nasal sprays. If this works well for you, then have [facility] ship the medication to your home - prescription already sent in.  Nasal saline spray (i.e., Simply Saline) or nasal saline lavage (i.e., NeilMed) is recommended as needed and prior to medicated nasal sprays. Continue allergy injections - patient is bringing vials (3) from prior allergist to finish then will mix our own vials based on current and past allergy testing  results. Patient prefers to add cats as she is still very symptomatic when around cats.   Consent was signed and action plan given. Declined Singulair Rx.  Allergic conjunctivitis of both eyes See assessment and plan as above.  Other adverse food reactions, not elsewhere classified, subsequent encounter Perioral pruritus with fresh bananas only, tolerates cooked forms. Red peppers cause abdominal pains but tolerates other forms of pepper. Avocados sometimes cause abdominal pains and sometimes doesn't.  Today's skin testing showed: Negative to avocado, banana.  No testing for red pepper available.  Start strict avoidance of red pepper and fresh bananas. She has some intolerance to avocado - limit consumption.  For mild symptoms you can take over the counter antihistamines such as Benadryl and monitor symptoms closely. If symptoms worsen or if you have severe symptoms including breathing issues, throat closure, significant swelling, whole body hives, severe diarrhea and vomiting, lightheadedness then seek immediate medical care. 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 This is caused by cross reactivity of pollen with fresh fruits and vegetables, and nuts. Symptoms are usually localized in the form of itching and burning in mouth and throat. Very rarely it can progress to more severe symptoms. Eating foods in cooked or processed forms usually minimizes symptoms. I recommended avoidance of eating the problem foods, especially during the peak season(s). Sometimes, OFAS can induce severe throat swelling or even a systemic reaction; with such instance, I advised them to report to a local ER. A list of common pollens and food cross-reactivities was provided to the patient.   Return in about 6 months (around 01/08/2023).  Meds ordered this encounter  Medications   Olopatadine-Mometasone (RYALTRIS) 665-25 MCG/ACT SUSP    Sig: Place 1-2 sprays into the nose in the morning and at bedtime.    Dispense:  29 g     Refill:  5    [phone]   Lab Orders  No laboratory test(s) ordered today    Other allergy screening: Asthma: no Medication allergy: no Hymenoptera allergy: no Urticaria: no Eczema:no History of recurrent infections suggestive of immunodeficency: no  Diagnostics: Skin Testing: Environmental allergy panel and select foods. Positive to grass, weed, ragweed, trees, mold, dog, horse, dust mites, cockroaches. Borderline to tobacco.  Negative to avocado, banana. Results discussed with patient/family.  Airborne Adult Perc - 07/08/22 1518     Time Antigen Placed 1500    Allergen Manufacturer Waynette Buttery    Location Back    Number of Test 59    1. Control-Buffer 50% Glycerol Negative    2. Control-Histamine 1 mg/ml 2+    3. Albumin saline Negative    4. Bahia Negative    5. [place] 2+    6. Johnson Negative    7. Kentucky Blue Negative    8. Meadow Fescue 3+    9. Perennial Rye 2+    10. Sweet Vernal 2+    11. Timothy 2+    12. Cocklebur Negative    13. Burweed Marshelder Negative    14. Ragweed, short 2+    15. Ragweed, Giant Negative    16. Plantain,  English 2+    17. Lamb's Quarters Negative    18. Sheep Sorrell Negative    19. Rough Pigweed Negative    20. Mont[place] Circle, Rough --   +/-   21. Mugwort, Common Negative    22. Ash mix Negative    23. Birch mix 2+  24. Beech American 2+    25. Box, Elder 2+    26. Cedar, red Negative    27. Cottonwood, Guinea-Bissau Negative    28. Elm mix 2+    29. Hickory 2+    30. Maple mix 2+    31. Oak, Guinea-[place] mix 2+    32. Pecan Pollen Negative    33. Pine mix 2+    34. Sycamore Eastern Negative    35. Walnut, Black Pollen 2+    36. Alternaria alternata Negative    37. Cladosporium Herbarum 2+    38. Aspergillus mix Negative    39. Penicillium mix Negative    40. Bipolaris sorokiniana (Helminthosporium) --   +/-   41. Drechslera spicifera (Curvularia) 2+    42. Mucor plumbeus Negative    43. Fusarium moniliforme Negative     44. Aureobasidium pullulans (pullulara) --   +/-   45. Rhizopus oryzae Negative    46. Botrytis cinera Negative    47. Epicoccum nigrum --   +/-   48. Phoma betae --   +/-   49. Candida Albicans Negative    50. Trichophyton mentagrophytes Negative    51. Mite, D Farinae  5,000 AU/ml 2+    52. Mite, D Pteronyssinus  5,000 AU/ml 2+    53. Cat Hair 10,000 BAU/ml Negative    54.  Dog Epithelia Negative    55. Mixed Feathers Negative    56. Horse Epithelia 2+    57. Cockroach, German Negative    58. Mouse Negative    59. Tobacco Leaf --   +/-            Intradermal - 07/08/22 1533     Time Antigen Placed 1533    Allergen Manufacturer Waynette Buttery    Location Arm    Number of Test 6    Control Negative    Johnson 3+    Mold 2 3+    Cat Negative    Dog 3+    Cockroach 2+             Food Adult Perc - 07/08/22 1500     Time Antigen Placed 1500    Allergen Manufacturer Greer    Location Back    Number of allergen test 2    48. Avocado Negative    57. Banana Negative             Past Medical History: Patient Active Problem List   Diagnosis Date Noted   Other allergic rhinitis 07/08/2022   Allergic conjunctivitis of both eyes 07/08/2022   Other adverse food reactions, not elsewhere classified, subsequent encounter 07/08/2022   Past Medical History:  Diagnosis Date   Anemia    Past Surgical History: Past Surgical History:  Procedure Laterality Date   CESAREAN SECTION     Medication List:  Current Outpatient Medications  Medication Sig Dispense Refill   norgestimate-ethinyl estradiol (ORTHO-CYCLEN) 0.25-35 MG-MCG tablet Take by mouth.     Olopatadine-Mometasone (RYALTRIS) X543819 MCG/ACT SUSP Place 1-2 sprays into the nose in the morning and at bedtime. 29 g 5   ferrous sulfate 325 (65 FE) MG tablet Take 1 tablet (325 mg total) by mouth daily. 30 tablet 0   No current facility-administered medications for this visit.   Allergies: No Known Allergies Social  History: Social History   Socioeconomic History   Marital status: Married    Spouse name: Not on file   Number of children: Not on file  Years of education: Not on file   Highest education level: Not on file  Occupational History   Not on file  Tobacco Use   Smoking status: Never   Smokeless tobacco: Never  Vaping Use   Vaping Use: Never used  Substance and Sexual Activity   Alcohol use: Yes    Comment: occ   Drug use: Never   Sexual activity: Not on file  Other Topics Concern   Not on file  Social History Narrative   Not on file   Social Determinants of Health   Financial Resource Strain: Not on file  Food Insecurity: Not on file  Transportation Needs: Not on file  Physical Activity: Not on file  Stress: Not on file  Social Connections: Not on file   Lives in a house built in 2003. Smoking: denies Occupation: not employed  Environmental HistorySurveyor, minerals in the house: no Engineer, civil (consulting) in the family room: no Carpet in the bedroom: yes Heating: gas Cooling: central Pet: yes 1 dog x 7 yrs  Family History: Family History  Problem Relation Age of Onset   Healthy Mother    Problem                               Relation Food allergy                          children Allergic rhino conjunctivitis     children   Review of Systems  Constitutional:  Negative for appetite change, chills, fever and unexpected weight change.  HENT:  Positive for congestion and rhinorrhea.   Eyes:  Positive for itching.  Respiratory:  Negative for cough, chest tightness, shortness of breath and wheezing.   Cardiovascular:  Negative for chest pain.  Gastrointestinal:  Negative for abdominal pain.  Genitourinary:  Negative for difficulty urinating.  Skin:  Negative for rash.  Allergic/Immunologic: Positive for environmental allergies.  Neurological:  Negative for headaches.    Objective: BP 138/88   Pulse 75   Temp 98.3 F (36.8 C)   Resp 18   Ht 5\' 3"  (1.6 m)   Wt 170  lb (77.1 kg)   SpO2 98%   BMI 30.11 kg/m  Body mass index is 30.11 kg/m. Physical Exam Vitals and nursing note reviewed.  Constitutional:      Appearance: Normal appearance. She is well-developed.  HENT:     Head: Normocephalic and atraumatic.     Right Ear: Tympanic membrane and external ear normal.     Left Ear: Tympanic membrane and external ear normal.     Nose: Nose normal.     Mouth/Throat:     Mouth: Mucous membranes are moist.     Pharynx: Oropharynx is clear.  Eyes:     Conjunctiva/sclera: Conjunctivae normal.  Cardiovascular:     Rate and Rhythm: Normal rate and regular rhythm.     Heart sounds: Normal heart sounds. No murmur heard.    No friction rub. No gallop.  Pulmonary:     Effort: Pulmonary effort is normal.     Breath sounds: Normal breath sounds. No wheezing, rhonchi or rales.  Musculoskeletal:     Cervical back: Neck supple.  Skin:    General: Skin is warm.     Findings: No rash.  Neurological:     Mental Status: She is alert and oriented to person, place, and time.  Psychiatric:  Behavior: Behavior normal.   The plan was reviewed with the patient/family, and all questions/concerned were addressed.  It was my pleasure to see Lauraine today and participate in her care. Please feel free to contact me with any questions or concerns.  Sincerely,  Wyline Mood, DO Allergy & Immunology  [facility] office: [phone] [facility] office: [phone]

## 2022-07-08 ENCOUNTER — Ambulatory Visit: Payer: BC Managed Care – PPO | Admitting: Allergy

## 2022-07-08 ENCOUNTER — Encounter: Payer: Self-pay | Admitting: Allergy

## 2022-07-08 VITALS — BP 138/88 | HR 75 | Temp 98.3°F | Resp 18 | Ht 63.0 in | Wt 170.0 lb

## 2022-07-08 DIAGNOSIS — H1013 Acute atopic conjunctivitis, bilateral: Secondary | ICD-10-CM | POA: Insufficient documentation

## 2022-07-08 DIAGNOSIS — K9049 Malabsorption due to intolerance, not elsewhere classified: Secondary | ICD-10-CM

## 2022-07-08 DIAGNOSIS — J3089 Other allergic rhinitis: Secondary | ICD-10-CM | POA: Diagnosis not present

## 2022-07-08 DIAGNOSIS — T781XXD Other adverse food reactions, not elsewhere classified, subsequent encounter: Secondary | ICD-10-CM

## 2022-07-08 MED ORDER — RYALTRIS 665-25 MCG/ACT NA SUSP: 1.0000 | Freq: Two times a day (BID) | NASAL | 5 refills | Status: DC

## 2022-07-08 NOTE — Assessment & Plan Note (Signed)
Perioral pruritus with fresh bananas only, tolerates cooked forms. Red peppers cause abdominal pains but tolerates other forms of pepper. Avocados sometimes cause abdominal pains and sometimes doesn't.   Today's skin testing showed: Negative to avocado, banana.   No testing for red pepper available.   Start strict avoidance of red pepper and fresh bananas.  She has some intolerance to avocado - limit consumption.   For mild symptoms you can take over the counter antihistamines such as Benadryl and monitor symptoms closely. If symptoms worsen or if you have severe symptoms including breathing issues, throat closure, significant swelling, whole body hives, severe diarrhea and vomiting, lightheadedness then seek immediate medical care.  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 This is caused by cross reactivity of pollen with fresh fruits and vegetables, and nuts. Symptoms are usually localized in the form of itching and burning in mouth and throat. Very rarely it can progress to more severe symptoms. Eating foods in cooked or processed forms usually minimizes symptoms. I recommended avoidance of eating the problem foods, especially during the peak season(s). Sometimes, OFAS can induce severe throat swelling or even a systemic reaction; with such instance, I advised them to report to a local ER. A list of common pollens and food cross-reactivities was provided to the patient.

## 2022-07-08 NOTE — Patient Instructions (Addendum)
Requesting records.  Today's skin testing showed: Positive to grass, weed, ragweed, trees, mold, dog, horse, dust mites, cockroaches. Borderline to tobacco.  Negative to avocado, banana.   Results given.  Environmental allergies Start environmental control measures as below. Use over the counter antihistamines such as Zyrtec (cetirizine), Claritin (loratadine), Allegra (fexofenadine), or Xyzal (levocetirizine) daily as needed. May take twice a day during allergy flares. May switch antihistamines every few months. Start Ryaltris (olopatadine + mometasone nasal spray combination) 1-2 sprays per nostril twice a day. Sample given. This replaces your other nasal sprays. If this works well for you, then have [facility] ship the medication to your home - prescription already sent in.  Nasal saline spray (i.e., Simply Saline) or nasal saline lavage (i.e., NeilMed) is recommended as needed and prior to medicated nasal sprays. Continue allergy injections. Consent was signed.   Food Start strict avoidance of red pepper and fresh bananas. Some type of intolerance to avocado. For mild symptoms you can take over the counter antihistamines such as Benadryl and monitor symptoms closely. If symptoms worsen or if you have severe symptoms including breathing issues, throat closure, significant swelling, whole body hives, severe diarrhea and vomiting, lightheadedness then seek immediate medical care.  Discussed that her food triggered oral and throat symptoms are likely caused by oral food allergy syndrome (OFAS). This is caused by cross reactivity of pollen with fresh fruits and vegetables, and nuts. Symptoms are usually localized in the form of itching and burning in mouth and throat. Very rarely it can progress to more severe symptoms. Eating foods in cooked or processed forms usually minimizes symptoms. I recommended avoidance of eating the problem foods, especially during the peak season(s). Sometimes, OFAS  can induce severe throat swelling or even a systemic reaction; with such instance, I advised them to report to a local ER. A list of common pollens and food cross-reactivities was provided to the patient.   Follow up in 6 months or sooner if needed.   Reducing Pollen Exposure Pollen seasons: trees (spring), grass (summer) and ragweed/weeds (fall). Keep windows closed in your home and car to lower pollen exposure.  Install air conditioning in the bedroom and throughout the house if possible.  Avoid going out in dry windy days - especially early morning. Pollen counts are highest between 5 - 10 AM and on dry, hot and windy days.  Save outside activities for late afternoon or after a heavy rain, when pollen levels are lower.  Avoid mowing of grass if you have grass pollen allergy. Be aware that pollen can also be transported indoors on people and pets.  Dry your clothes in an automatic dryer rather than hanging them outside where they might collect pollen.  Rinse hair and eyes before bedtime. Mold Control Mold and fungi can grow on a variety of surfaces provided certain temperature and moisture conditions exist.  Outdoor molds grow on plants, decaying vegetation and soil. The major outdoor mold, Alternaria and Cladosporium, are found in very high numbers during hot and dry conditions. Generally, a late summer - fall peak is seen for common outdoor fungal spores. Rain will temporarily lower outdoor mold spore count, but counts rise rapidly when the rainy period ends. The most important indoor molds are Aspergillus and Penicillium. Dark, humid and poorly ventilated basements are ideal sites for mold growth. The next most common sites of mold growth are the bathroom and the kitchen. Outdoor (Seasonal) Mold Control Use air conditioning and keep windows closed. Avoid exposure to decaying  vegetation. Avoid leaf raking. Avoid grain handling. Consider wearing a face mask if working in moldy areas.   Indoor (Perennial) Mold Control  Maintain humidity below 50%. Get rid of mold growth on hard surfaces with water, detergent and, if necessary, 5% bleach (do not mix with other cleaners). Then dry the area completely. If mold covers an area more than 10 square feet, consider hiring an indoor environmental professional. For clothing, washing with soap and water is best. If moldy items cannot be cleaned and dried, throw them away. Remove sources e.g. contaminated carpets. Repair and seal leaking roofs or pipes. Using dehumidifiers in damp basements may be helpful, but empty the water and clean units regularly to prevent mildew from forming. All rooms, especially basements, bathrooms and kitchens, require ventilation and cleaning to deter mold and mildew growth. Avoid carpeting on concrete or damp floors, and storing items in damp areas. Control of House Dust Mite Allergen Dust mite allergens are a common trigger of allergy and asthma symptoms. While they can be found throughout the house, these microscopic creatures thrive in warm, humid environments such as bedding, upholstered furniture and carpeting. Because so much time is spent in the bedroom, it is essential to reduce mite levels there.  Encase pillows, mattresses, and box springs in special allergen-proof fabric covers or airtight, zippered plastic covers.  Bedding should be washed weekly in hot water (130 F) and dried in a hot dryer. Allergen-proof covers are available for comforters and pillows that can't be regularly washed.  Wash the allergy-proof covers every few months. Minimize clutter in the bedroom. Keep pets out of the bedroom.  Keep humidity less than 50% by using a dehumidifier or air conditioning. You can buy a humidity measuring device called a hygrometer to monitor this.  If possible, replace carpets with hardwood, linoleum, or washable area rugs. If that's not possible, vacuum frequently with a vacuum that has a HEPA  filter. Remove all upholstered furniture and non-washable window drapes from the bedroom. Remove all non-washable stuffed toys from the bedroom.  Wash stuffed toys weekly. Pet Allergen Avoidance: Contrary to popular opinion, there are no "hypoallergenic" breeds of dogs or cats. That is because people are not allergic to an animal's hair, but to an allergen found in the animal's saliva, dander (dead skin flakes) or urine. Pet allergy symptoms typically occur within minutes. For some people, symptoms can build up and become most severe 8 to 12 hours after contact with the animal. People with severe allergies can experience reactions in public places if dander has been transported on the pet owners' clothing. Keeping an animal outdoors is only a partial solution, since homes with pets in the yard still have higher concentrations of animal allergens. Before getting a pet, ask your allergist to determine if you are allergic to animals. If your pet is already considered part of your family, try to minimize contact and keep the pet out of the bedroom and other rooms where you spend a great deal of time. As with dust mites, vacuum carpets often or replace carpet with a hardwood floor, tile or linoleum. High-efficiency particulate air (HEPA) cleaners can reduce allergen levels over time. While dander and saliva are the source of cat and dog allergens, urine is the source of allergens from rabbits, hamsters, mice and [place] pigs; so ask a non-allergic family member to clean the animal's cage. If you have a pet allergy, talk to your allergist about the potential for allergy immunotherapy (allergy shots). This strategy can  often provide long-term relief. Cockroach Allergen Avoidance Cockroaches are often found in the homes of densely populated urban areas, schools or commercial buildings, but these creatures can lurk almost anywhere. This does not mean that you have a dirty house or living area. Block all areas  where roaches can enter the home. This includes crevices, wall cracks and windows.  Cockroaches need water to survive, so fix and seal all leaky faucets and pipes. Have an exterminator go through the house when your family and pets are gone to eliminate any remaining roaches. Keep food in lidded containers and put pet food dishes away after your pets are done eating. Vacuum and sweep the floor after meals, and take out garbage and recyclables. Use lidded garbage containers in the kitchen. Wash dishes immediately after use and clean under stoves, refrigerators or toasters where crumbs can accumulate. Wipe off the stove and other kitchen surfaces and cupboards regularly.

## 2022-07-08 NOTE — Assessment & Plan Note (Signed)
.   See assessment and plan as above. 

## 2022-07-08 NOTE — Assessment & Plan Note (Addendum)
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 AIT from age [age] and then again from 39s to now with some benefit. 2015 skin testing positive to trees, grass, weed, ragweed, dust mites, cockroach, dog, cat, mold. Most recent skin testing results not available for review.  Requesting records.  Today's skin testing showed: Positive to grass, weed, ragweed, trees, mold, dog, horse, dust mites, cockroaches. Borderline to tobacco.   Start environmental control measures as below.  Use over the counter antihistamines such as Zyrtec (cetirizine), Claritin (loratadine), Allegra (fexofenadine), or Xyzal (levocetirizine) daily as needed. May take twice a day during allergy flares. May switch antihistamines every few months.  Start Ryaltris (olopatadine + mometasone nasal spray combination) 1-2 sprays per nostril twice a day. Sample given.  This replaces your other nasal sprays.  If this works well for you, then have [facility] ship the medication to your home - prescription already sent in.   Nasal saline spray (i.e., Simply Saline) or nasal saline lavage (i.e., NeilMed) is recommended as needed and prior to medicated nasal sprays.  Continue allergy injections - patient is bringing vials (3) from prior allergist to finish then will mix our own vials based on current and past allergy testing results.  Patient prefers to add cats as she is still very symptomatic when around cats.   Consent was signed and action plan given.  Declined Singulair Rx.

## 2022-07-13 ENCOUNTER — Encounter: Payer: Self-pay | Admitting: Allergy

## 2022-07-13 NOTE — Progress Notes (Signed)
Reviewed notes from [facility]. Date of service: multiple. See scanned notes for full documentation. Last AIT on 07/06/2022: 1:1 0.37mL GTW) 1:1 0.22mL (CDMDm) 1:1 0.39mL (M) 10/28/2020 skin testing positive to trees, grass, weed, ragweed, dust mites, dog, cat, molds.

## 2022-07-14 DIAGNOSIS — Z85828 Personal history of other malignant neoplasm of skin: Secondary | ICD-10-CM | POA: Diagnosis not present

## 2022-07-14 DIAGNOSIS — L820 Inflamed seborrheic keratosis: Secondary | ICD-10-CM | POA: Diagnosis not present

## 2022-07-14 DIAGNOSIS — I781 Nevus, non-neoplastic: Secondary | ICD-10-CM | POA: Diagnosis not present

## 2022-07-14 DIAGNOSIS — L821 Other seborrheic keratosis: Secondary | ICD-10-CM | POA: Diagnosis not present

## 2022-07-14 DIAGNOSIS — L720 Epidermal cyst: Secondary | ICD-10-CM | POA: Diagnosis not present

## 2022-07-20 ENCOUNTER — Ambulatory Visit (INDEPENDENT_AMBULATORY_CARE_PROVIDER_SITE_OTHER): Payer: BC Managed Care – PPO

## 2022-07-20 DIAGNOSIS — J309 Allergic rhinitis, unspecified: Secondary | ICD-10-CM | POA: Diagnosis not present

## 2022-07-21 NOTE — Progress Notes (Signed)
Immunotherapy   Patient Details  Name: NIKIE CID MRN: [id number] Date of Birth: 09-01-1976  07/21/2022  Duwayne Heck started receiving allergy injections from an outside office. Patient hasn't had any reactions Following schedule: See outside records  Frequency:See outside records Epi-Pen:Epi-Pen Available   Consent signed and patient instructions given.   Florence Canner 07/21/2022, 9:09 AM

## 2022-08-05 ENCOUNTER — Ambulatory Visit (INDEPENDENT_AMBULATORY_CARE_PROVIDER_SITE_OTHER): Payer: BC Managed Care – PPO

## 2022-08-05 DIAGNOSIS — J309 Allergic rhinitis, unspecified: Secondary | ICD-10-CM

## 2022-08-19 ENCOUNTER — Ambulatory Visit (INDEPENDENT_AMBULATORY_CARE_PROVIDER_SITE_OTHER): Payer: BC Managed Care – PPO

## 2022-08-19 DIAGNOSIS — J309 Allergic rhinitis, unspecified: Secondary | ICD-10-CM

## 2022-09-02 ENCOUNTER — Ambulatory Visit (INDEPENDENT_AMBULATORY_CARE_PROVIDER_SITE_OTHER): Payer: BC Managed Care – PPO

## 2022-09-02 DIAGNOSIS — J309 Allergic rhinitis, unspecified: Secondary | ICD-10-CM

## 2022-09-02 DIAGNOSIS — J019 Acute sinusitis, unspecified: Secondary | ICD-10-CM | POA: Diagnosis not present

## 2022-09-16 ENCOUNTER — Ambulatory Visit (INDEPENDENT_AMBULATORY_CARE_PROVIDER_SITE_OTHER): Payer: BC Managed Care – PPO

## 2022-09-16 DIAGNOSIS — J309 Allergic rhinitis, unspecified: Secondary | ICD-10-CM

## 2022-09-17 ENCOUNTER — Other Ambulatory Visit: Payer: Self-pay | Admitting: Allergy

## 2022-09-17 DIAGNOSIS — J3089 Other allergic rhinitis: Secondary | ICD-10-CM

## 2022-09-17 DIAGNOSIS — J301 Allergic rhinitis due to pollen: Secondary | ICD-10-CM | POA: Diagnosis not present

## 2022-09-17 DIAGNOSIS — J3081 Allergic rhinitis due to animal (cat) (dog) hair and dander: Secondary | ICD-10-CM | POA: Diagnosis not present

## 2022-09-17 NOTE — Progress Notes (Signed)
Patient finishing up current vials from outside allergist and will be making new vials using old and new testing results.

## 2022-09-20 NOTE — Treatment Plan (Deleted)
Aeroallergen Immunotherapy   Ordering Provider: Dr. Yoon Kim   Patient Details  Name: Kathy Shields  MRN: [id number]  Date of Birth: 11/24/1975   Order 2 of 2   Vial Label: M-C-D-Cr   0.2 ml (Volume)  1:20 Concentration -- Alternaria alternata  0.2 ml (Volume)  1:10 Concentration -- Aspergillus mix  0.2 ml (Volume)  1:10 Concentration -- Penicillium mix  0.2 ml (Volume)  1:20 Concentration -- Bipolaris sorokiniana  0.2 ml (Volume)  1:20 Concentration -- Drechslera spicifera  0.2 ml (Volume)  1:40 Concentration -- Aureobasidium pullulans  0.2 ml (Volume)  1:40 Concentration -- Botrytis cinerea  0.2 ml (Volume)  1:40 Concentration -- Epicoccum nigrum  0.2 ml (Volume)  1:40 Concentration -- Phoma betae  0.5 ml (Volume)  1:10 Concentration -- Cat Hair  0.3 ml (Volume)  1:20 Concentration -- Cockroach, German  0.5 ml (Volume)  1:10 Concentration -- Dog Epithelia    3.1  ml Extract Subtotal  1.9  ml Diluent  5.0  ml Maintenance Total   Schedule:  B  Blue Vial (1:100,000): Schedule B (6 doses)  Yellow Vial (1:10,000): Schedule B (6 doses)  Green Vial (1:1,000): Schedule B (6 doses)  Red Vial (1:100): Schedule A (14 doses)   Special Instructions: may come in twice per week during build up.  

## 2022-09-20 NOTE — Treatment Plan (Deleted)
Aeroallergen Immunotherapy   Ordering Provider: Dr. Yoon Kim   Patient Details  Name: Kathy Shields  MRN: [id number]  Date of Birth: 10/06/1976   Order 2 of 2   Vial Label: M-C-D-Cr   0.2 ml (Volume)  1:20 Concentration -- Alternaria alternata  0.2 ml (Volume)  1:10 Concentration -- Aspergillus mix  0.2 ml (Volume)  1:10 Concentration -- Penicillium mix  0.2 ml (Volume)  1:20 Concentration -- Bipolaris sorokiniana  0.2 ml (Volume)  1:20 Concentration -- Drechslera spicifera  0.2 ml (Volume)  1:40 Concentration -- Aureobasidium pullulans  0.2 ml (Volume)  1:40 Concentration -- Botrytis cinerea  0.2 ml (Volume)  1:40 Concentration -- Epicoccum nigrum  0.2 ml (Volume)  1:40 Concentration -- Phoma betae  0.5 ml (Volume)  1:10 Concentration -- Cat Hair  0.3 ml (Volume)  1:20 Concentration -- Cockroach, German  0.5 ml (Volume)  1:10 Concentration -- Dog Epithelia    3.1  ml Extract Subtotal  1.9  ml Diluent  5.0  ml Maintenance Total   Schedule:  B  Blue Vial (1:100,000): Schedule B (6 doses)  Yellow Vial (1:10,000): Schedule B (6 doses)  Green Vial (1:1,000): Schedule B (6 doses)  Red Vial (1:100): Schedule A (14 doses)   Special Instructions: may come in twice per week during build up.  

## 2022-09-20 NOTE — Treatment Plan (Deleted)
Aeroallergen Immunotherapy   Ordering Provider: Dr. Rexene Alberts   Patient Details  Name: Kathy Shields  MRN: [id number]  Date of Birth: 11/14/1976   Order 2 of 2   Vial Label: M-C-D-Cr   0.2 ml (Volume)  1:20 Concentration -- Alternaria alternata  0.2 ml (Volume)  1:10 Concentration -- Aspergillus mix  0.2 ml (Volume)  1:10 Concentration -- Penicillium mix  0.2 ml (Volume)  1:20 Concentration -- Bipolaris sorokiniana  0.2 ml (Volume)  1:20 Concentration -- Drechslera spicifera  0.2 ml (Volume)  1:40 Concentration -- Aureobasidium pullulans  0.2 ml (Volume)  1:40 Concentration -- Botrytis cinerea  0.2 ml (Volume)  1:40 Concentration -- Epicoccum nigrum  0.2 ml (Volume)  1:40 Concentration -- Phoma betae  0.5 ml (Volume)  1:10 Concentration -- Cat Hair  0.3 ml (Volume)  1:20 Concentration -- Cockroach, German  0.5 ml (Volume)  1:10 Concentration -- Dog Epithelia    3.1  ml Extract Subtotal  1.9  ml Diluent  5.0  ml Maintenance Total   Schedule:  B  Blue Vial (1:100,000): Schedule B (6 doses)  Yellow Vial (1:10,000): Schedule B (6 doses)  Green Vial (1:1,000): Schedule B (6 doses)  Red Vial (1:100): Schedule A (14 doses)   Special Instructions: may come in twice per week during build up.

## 2022-09-20 NOTE — Treatment Plan (Deleted)
Aeroallergen Immunotherapy   Ordering Provider: Dr. Yoon Kim   Patient Details  Name: Kathy Shields  MRN: [id number]  Date of Birth: 04/02/1976   Order 2 of 2   Vial Label: M-C-D-Cr   0.2 ml (Volume)  1:20 Concentration -- Alternaria alternata  0.2 ml (Volume)  1:10 Concentration -- Aspergillus mix  0.2 ml (Volume)  1:10 Concentration -- Penicillium mix  0.2 ml (Volume)  1:20 Concentration -- Bipolaris sorokiniana  0.2 ml (Volume)  1:20 Concentration -- Drechslera spicifera  0.2 ml (Volume)  1:40 Concentration -- Aureobasidium pullulans  0.2 ml (Volume)  1:40 Concentration -- Botrytis cinerea  0.2 ml (Volume)  1:40 Concentration -- Epicoccum nigrum  0.2 ml (Volume)  1:40 Concentration -- Phoma betae  0.5 ml (Volume)  1:10 Concentration -- Cat Hair  0.3 ml (Volume)  1:20 Concentration -- Cockroach, German  0.5 ml (Volume)  1:10 Concentration -- Dog Epithelia    3.1  ml Extract Subtotal  1.9  ml Diluent  5.0  ml Maintenance Total   Schedule:  B  Blue Vial (1:100,000): Schedule B (6 doses)  Yellow Vial (1:10,000): Schedule B (6 doses)  Green Vial (1:1,000): Schedule B (6 doses)  Red Vial (1:100): Schedule A (14 doses)   Special Instructions: may come in twice per week during build up.  

## 2022-09-20 NOTE — Progress Notes (Signed)
VIALS NOT MADE UNTIL APPT IS SCHED 

## 2022-09-20 NOTE — Treatment Plan (Deleted)
Aeroallergen Immunotherapy   Ordering Provider: Dr. Yoon Kim   Patient Details  Name: Kathy Shields  MRN: [id number]  Date of Birth: 01/16/1976   Order 2 of 2   Vial Label: M-C-D-Cr   0.2 ml (Volume)  1:20 Concentration -- Alternaria alternata  0.2 ml (Volume)  1:10 Concentration -- Aspergillus mix  0.2 ml (Volume)  1:10 Concentration -- Penicillium mix  0.2 ml (Volume)  1:20 Concentration -- Bipolaris sorokiniana  0.2 ml (Volume)  1:20 Concentration -- Drechslera spicifera  0.2 ml (Volume)  1:40 Concentration -- Aureobasidium pullulans  0.2 ml (Volume)  1:40 Concentration -- Botrytis cinerea  0.2 ml (Volume)  1:40 Concentration -- Epicoccum nigrum  0.2 ml (Volume)  1:40 Concentration -- Phoma betae  0.5 ml (Volume)  1:10 Concentration -- Cat Hair  0.3 ml (Volume)  1:20 Concentration -- Cockroach, German  0.5 ml (Volume)  1:10 Concentration -- Dog Epithelia    3.1  ml Extract Subtotal  1.9  ml Diluent  5.0  ml Maintenance Total   Schedule:  B  Blue Vial (1:100,000): Schedule B (6 doses)  Yellow Vial (1:10,000): Schedule B (6 doses)  Green Vial (1:1,000): Schedule B (6 doses)  Red Vial (1:100): Schedule A (14 doses)   Special Instructions: may come in twice per week during build up.  

## 2022-09-20 NOTE — Treatment Plan (Signed)
Aeroallergen Immunotherapy   Ordering Provider: Dr. Yoon Kim   Patient Details  Name: Kathy Shields  MRN: [id number]  Date of Birth: 01/22/1976   Order 2 of 2   Vial Label: M-C-D-Cr   0.2 ml (Volume)  1:20 Concentration -- Alternaria alternata  0.2 ml (Volume)  1:10 Concentration -- Aspergillus mix  0.2 ml (Volume)  1:10 Concentration -- Penicillium mix  0.2 ml (Volume)  1:20 Concentration -- Bipolaris sorokiniana  0.2 ml (Volume)  1:20 Concentration -- Drechslera spicifera  0.2 ml (Volume)  1:40 Concentration -- Aureobasidium pullulans  0.2 ml (Volume)  1:40 Concentration -- Botrytis cinerea  0.2 ml (Volume)  1:40 Concentration -- Epicoccum nigrum  0.2 ml (Volume)  1:40 Concentration -- Phoma betae  0.5 ml (Volume)  1:10 Concentration -- Cat Hair  0.3 ml (Volume)  1:20 Concentration -- Cockroach, German  0.5 ml (Volume)  1:10 Concentration -- Dog Epithelia    3.1  ml Extract Subtotal  1.9  ml Diluent  5.0  ml Maintenance Total   Schedule:  B  Blue Vial (1:100,000): Schedule B (6 doses)  Yellow Vial (1:10,000): Schedule B (6 doses)  Green Vial (1:1,000): Schedule B (6 doses)  Red Vial (1:100): Schedule A (14 doses)   Special Instructions: may come in twice per week during build up.  

## 2022-09-20 NOTE — Treatment Plan (Deleted)
Aeroallergen Immunotherapy   Ordering Provider: Dr. Yoon Kim   Patient Details  Name: Kathy Shields  MRN: [id number]  Date of Birth: 04/24/1976   Order 2 of 2   Vial Label: M-C-D-Cr   0.2 ml (Volume)  1:20 Concentration -- Alternaria alternata  0.2 ml (Volume)  1:10 Concentration -- Aspergillus mix  0.2 ml (Volume)  1:10 Concentration -- Penicillium mix  0.2 ml (Volume)  1:20 Concentration -- Bipolaris sorokiniana  0.2 ml (Volume)  1:20 Concentration -- Drechslera spicifera  0.2 ml (Volume)  1:40 Concentration -- Aureobasidium pullulans  0.2 ml (Volume)  1:40 Concentration -- Botrytis cinerea  0.2 ml (Volume)  1:40 Concentration -- Epicoccum nigrum  0.2 ml (Volume)  1:40 Concentration -- Phoma betae  0.5 ml (Volume)  1:10 Concentration -- Cat Hair  0.3 ml (Volume)  1:20 Concentration -- Cockroach, German  0.5 ml (Volume)  1:10 Concentration -- Dog Epithelia    3.1  ml Extract Subtotal  1.9  ml Diluent  5.0  ml Maintenance Total   Schedule:  B  Blue Vial (1:100,000): Schedule B (6 doses)  Yellow Vial (1:10,000): Schedule B (6 doses)  Green Vial (1:1,000): Schedule B (6 doses)  Red Vial (1:100): Schedule A (14 doses)   Special Instructions: may come in twice per week during build up.  

## 2022-09-20 NOTE — Treatment Plan (Deleted)
Aeroallergen Immunotherapy   Ordering Provider: Dr. Yoon Kim   Patient Details  Name: Kathy Shields  MRN: [id number]  Date of Birth: 05/11/1976   Order 2 of 2   Vial Label: M-C-D-Cr   0.2 ml (Volume)  1:20 Concentration -- Alternaria alternata  0.2 ml (Volume)  1:10 Concentration -- Aspergillus mix  0.2 ml (Volume)  1:10 Concentration -- Penicillium mix  0.2 ml (Volume)  1:20 Concentration -- Bipolaris sorokiniana  0.2 ml (Volume)  1:20 Concentration -- Drechslera spicifera  0.2 ml (Volume)  1:40 Concentration -- Aureobasidium pullulans  0.2 ml (Volume)  1:40 Concentration -- Botrytis cinerea  0.2 ml (Volume)  1:40 Concentration -- Epicoccum nigrum  0.2 ml (Volume)  1:40 Concentration -- Phoma betae  0.5 ml (Volume)  1:10 Concentration -- Cat Hair  0.3 ml (Volume)  1:20 Concentration -- Cockroach, German  0.5 ml (Volume)  1:10 Concentration -- Dog Epithelia    3.1  ml Extract Subtotal  1.9  ml Diluent  5.0  ml Maintenance Total   Schedule:  B  Blue Vial (1:100,000): Schedule B (6 doses)  Yellow Vial (1:10,000): Schedule B (6 doses)  Green Vial (1:1,000): Schedule B (6 doses)  Red Vial (1:100): Schedule A (14 doses)   Special Instructions: may come in twice per week during build up.  

## 2022-09-20 NOTE — Progress Notes (Signed)
Aeroallergen Immunotherapy  Ordering Provider: Dr. Rexene Alberts  Patient Details Name: Kathy Shields MRN: [id number] Date of Birth: [birth year]  Order 1 of 2  Vial Label: G-W-RW-T-Dm  0.3 ml (Volume)  BAU Concentration -- 7 Grass Mix* 100,000 ([address] [place], [place], [place], [place], RedTop, Sweet Vernal, Timothy) 0.2 ml (Volume)  1:20 Concentration -- [place] 0.3 ml (Volume)  BAU Concentration -- [place] 10,000 0.2 ml (Volume)  1:20 Concentration -- [place] 0.3 ml (Volume)  1:20 Concentration -- Ragweed Mix 0.5 ml (Volume)  1:20 Concentration -- Weed Mix* 0.5 ml (Volume)  1:20 Concentration -- Eastern 10 Tree Mix (also Sweet Gum) 0.2 ml (Volume)  1:20 Concentration -- Box Elder 0.2 ml (Volume)  1:10 Concentration -- Cedar, red 0.2 ml (Volume)  1:10 Concentration -- Pine Mix 0.2 ml (Volume)  1:20 Concentration -- Walnut, Black Pollen 0.5 ml (Volume)   AU Concentration -- Mite Mix (DF 5,000 & DP 5,000)   3.6  ml Extract Subtotal 1.4  ml Diluent 5.0  ml Maintenance Total  Schedule:  B Blue Vial (1:100,000): Schedule B (6 doses) Yellow Vial (1:10,000): Schedule B (6 doses) Green Vial (1:1,000): Schedule B (6 doses) Red Vial (1:100): Schedule A (14 doses)  Special Instructions: may come in twice per week during build up.

## 2022-09-23 ENCOUNTER — Ambulatory Visit (INDEPENDENT_AMBULATORY_CARE_PROVIDER_SITE_OTHER): Payer: BC Managed Care – PPO

## 2022-09-23 DIAGNOSIS — J309 Allergic rhinitis, unspecified: Secondary | ICD-10-CM | POA: Diagnosis not present

## 2022-09-24 DIAGNOSIS — J019 Acute sinusitis, unspecified: Secondary | ICD-10-CM | POA: Diagnosis not present

## 2022-09-30 ENCOUNTER — Ambulatory Visit (INDEPENDENT_AMBULATORY_CARE_PROVIDER_SITE_OTHER): Payer: BC Managed Care – PPO

## 2022-09-30 DIAGNOSIS — J309 Allergic rhinitis, unspecified: Secondary | ICD-10-CM | POA: Diagnosis not present

## 2022-10-14 ENCOUNTER — Ambulatory Visit (INDEPENDENT_AMBULATORY_CARE_PROVIDER_SITE_OTHER): Payer: BC Managed Care – PPO

## 2022-10-14 DIAGNOSIS — J309 Allergic rhinitis, unspecified: Secondary | ICD-10-CM

## 2022-10-21 ENCOUNTER — Ambulatory Visit (INDEPENDENT_AMBULATORY_CARE_PROVIDER_SITE_OTHER): Payer: BC Managed Care – PPO | Admitting: *Deleted

## 2022-10-21 DIAGNOSIS — J309 Allergic rhinitis, unspecified: Secondary | ICD-10-CM

## 2022-10-28 ENCOUNTER — Ambulatory Visit (INDEPENDENT_AMBULATORY_CARE_PROVIDER_SITE_OTHER): Payer: BC Managed Care – PPO

## 2022-10-28 DIAGNOSIS — J309 Allergic rhinitis, unspecified: Secondary | ICD-10-CM | POA: Diagnosis not present

## 2022-11-25 ENCOUNTER — Ambulatory Visit (INDEPENDENT_AMBULATORY_CARE_PROVIDER_SITE_OTHER): Payer: 59

## 2022-11-25 DIAGNOSIS — J309 Allergic rhinitis, unspecified: Secondary | ICD-10-CM | POA: Diagnosis not present

## 2022-12-21 ENCOUNTER — Ambulatory Visit (INDEPENDENT_AMBULATORY_CARE_PROVIDER_SITE_OTHER): Payer: 59

## 2022-12-21 DIAGNOSIS — J309 Allergic rhinitis, unspecified: Secondary | ICD-10-CM

## 2023-01-11 LAB — COLOGUARD: COLOGUARD: NEGATIVE

## 2023-01-18 ENCOUNTER — Ambulatory Visit (INDEPENDENT_AMBULATORY_CARE_PROVIDER_SITE_OTHER): Payer: 59

## 2023-01-18 DIAGNOSIS — J309 Allergic rhinitis, unspecified: Secondary | ICD-10-CM

## 2023-01-18 NOTE — Progress Notes (Signed)
VIALS EXP 01-18-24

## 2023-01-19 DIAGNOSIS — J3089 Other allergic rhinitis: Secondary | ICD-10-CM | POA: Diagnosis not present

## 2023-01-25 DIAGNOSIS — J3081 Allergic rhinitis due to animal (cat) (dog) hair and dander: Secondary | ICD-10-CM | POA: Diagnosis not present

## 2023-02-15 ENCOUNTER — Ambulatory Visit (INDEPENDENT_AMBULATORY_CARE_PROVIDER_SITE_OTHER): Payer: 59

## 2023-02-15 DIAGNOSIS — J309 Allergic rhinitis, unspecified: Secondary | ICD-10-CM | POA: Diagnosis not present

## 2023-03-15 ENCOUNTER — Ambulatory Visit (INDEPENDENT_AMBULATORY_CARE_PROVIDER_SITE_OTHER): Payer: 59

## 2023-03-15 DIAGNOSIS — J309 Allergic rhinitis, unspecified: Secondary | ICD-10-CM

## 2023-04-14 ENCOUNTER — Ambulatory Visit (INDEPENDENT_AMBULATORY_CARE_PROVIDER_SITE_OTHER): Payer: 59

## 2023-04-14 DIAGNOSIS — J309 Allergic rhinitis, unspecified: Secondary | ICD-10-CM | POA: Diagnosis not present

## 2023-05-03 ENCOUNTER — Ambulatory Visit (INDEPENDENT_AMBULATORY_CARE_PROVIDER_SITE_OTHER): Payer: 59 | Admitting: Allergy

## 2023-05-03 ENCOUNTER — Encounter: Payer: Self-pay | Admitting: Allergy

## 2023-05-03 VITALS — BP 132/88 | HR 76 | Temp 98.8°F | Resp 16

## 2023-05-03 DIAGNOSIS — J4520 Mild intermittent asthma, uncomplicated: Secondary | ICD-10-CM | POA: Diagnosis not present

## 2023-05-03 DIAGNOSIS — H1010 Acute atopic conjunctivitis, unspecified eye: Secondary | ICD-10-CM

## 2023-05-03 DIAGNOSIS — H1013 Acute atopic conjunctivitis, bilateral: Secondary | ICD-10-CM | POA: Diagnosis not present

## 2023-05-03 DIAGNOSIS — J302 Other seasonal allergic rhinitis: Secondary | ICD-10-CM

## 2023-05-03 DIAGNOSIS — T781XXD Other adverse food reactions, not elsewhere classified, subsequent encounter: Secondary | ICD-10-CM

## 2023-05-03 DIAGNOSIS — J3089 Other allergic rhinitis: Secondary | ICD-10-CM

## 2023-05-03 DIAGNOSIS — K9049 Malabsorption due to intolerance, not elsewhere classified: Secondary | ICD-10-CM | POA: Diagnosis not present

## 2023-05-03 MED ORDER — AZELASTINE HCL 0.1 % NA SOLN: 1.0000 | Freq: Two times a day (BID) | NASAL | 5 refills | Status: AC | PRN

## 2023-05-03 MED ORDER — LEVALBUTEROL TARTRATE 45 MCG/ACT IN AERO: 2.0000 | INHALATION_SPRAY | RESPIRATORY_TRACT | 2 refills | Status: AC | PRN

## 2023-05-03 NOTE — Progress Notes (Unsigned)
Follow Up Note  RE: Kathy Shields MRN: [id number] DOB: 1975-11-23 Date of Office Visit: 05/03/2023  Referring provider: Koren Shiver, DO Primary care provider: Koren Shiver, DO  Chief Complaint: Allergies (Sx despite allergy shots. Chest tightness after mowing. )  History of Present Illness: I had the pleasure of seeing Kathy Shields for a follow up visit at the [facility] on 05/04/2023. She is a 47 y.o. female, who is being followed for allergic rhinoconjunctivitis on AIT, adverse food reaction. Her previous allergy office visit was on 07/08/2022 with Dr. Selena Batten. Today is a regular follow up visit. She is accompanied today by her husband who provided/contributed to the history.   Environmental allergies Patient finished her vials that she brought with her. She has some localized reactions to the injections still.   She is noticing some asthma symptoms - she noted chest tightness after mowing the lawn for 3 days. She has been using a mask which helps. The albuterol seems to make her shaky. She never had issues like this before.   Currently taking daily OTC antihistamines.  Not taking any nasal sprays. Ryaltris was not covered.  Willing to try Singulair - discussed side effects.    Food Avoiding red pepper and fresh bananas.  Assessment and Plan: Kathy Shields is a 47 y.o. 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 AIT from age [age] and then again from 67s to now with some benefit. 2015 skin testing positive to trees, grass, weed, ragweed, dust mites, cockroach, dog, cat, mold. 2023 skin testing was positive to grass, weed, ragweed, trees, mold, dog, horse, dust mites, cockroaches. Borderline to tobacco.  Interim history - Not sure if AIT is helping as now noticing some RAD symptoms after cutting grass. Ryaltris not covered.  Continue environmental control measures.  Wear  mask when cutting grass and shower right afterwards, use nettipot to clear out sinuses.  Start Singulair (montelukast) 10mg  daily at night. Cautioned that in some children/adults can experience behavioral changes including hyperactivity, agitation, depression, sleep disturbances and suicidal ideations. These side effects are rare, but if you notice them you should notify me and discontinue Singulair (montelukast). Use over the counter antihistamines such as Zyrtec (cetirizine), Claritin (loratadine), Allegra (fexofenadine), or Xyzal (levocetirizine) daily as needed. May take twice a day during allergy flares. May switch antihistamines every few months. Use azelastine nasal spray 1-2 sprays per nostril twice a day as needed for runny nose/drainage. Use Flonase (fluticasone) nasal spray 1 spray per nostril twice a day as needed for nasal congestion.  Nasal saline spray (i.e., Simply Saline) or nasal saline lavage (i.e., NeilMed) is recommended as needed and prior to medicated nasal sprays. Continue allergy injections with new vials formulated by our office - if no improvement in 6-12 months consider stopping.   Reactive airway disease, mild intermittent, uncomplicated Noticed chest tightness for 3 days after mowing lawn. Albuterol makes her shaky. No prior asthma diagnosis. Today's spirometry was normal. May use levoalbuterol rescue inhaler 2 puffs every 4 to 6 hours as needed for shortness of breath, chest tightness, coughing, and wheezing.  Monitor frequency of use - if you need to use it more than twice per week on a consistent basis let us know.   Other adverse food reactions, not elsewhere classified, subsequent encounter Past history - perioral pruritus with fresh bananas only, tolerates cooked forms. Red peppers cause abdominal pains but tolerates other forms of  pepper. Avocados sometimes cause abdominal pains and sometimes doesn't. 2023 skin testing showed: Negative to avocado, banana. No  testing for red pepper available.  Continue strict avoidance of red pepper and fresh bananas. Some type of intolerance to avocado. For mild symptoms you can take over the counter antihistamines such as Benadryl and monitor symptoms closely. If symptoms worsen or if you have severe symptoms including breathing issues, throat closure, significant swelling, whole body hives, severe diarrhea and vomiting, lightheadedness then seek immediate medical care.  Return in about 3 months (around 08/03/2023).  Meds ordered this encounter  Medications   azelastine (ASTELIN) 0.1 % nasal spray    Sig: Place 1-2 sprays into both nostrils 2 (two) times daily as needed (nasal drainage). Use in each nostril as directed    Dispense:  30 mL    Refill:  5   levalbuterol (XOPENEX HFA) 45 MCG/ACT inhaler    Sig: Inhale 2 puffs into the lungs every 4 (four) hours as needed for wheezing or shortness of breath (coughing fits).    Dispense:  1 each    Refill:  2   Lab Orders  No laboratory test(s) ordered today    Diagnostics: Spirometry:  Tracings reviewed. Her effort: Good reproducible efforts. FVC: 3.85L FEV1: 3.12L, 98% predicted FEV1/FVC ratio: 81% Interpretation: Spirometry consistent with normal pattern.  Please see scanned spirometry results for details.  Medication List:  Current Outpatient Medications  Medication Sig Dispense Refill   azelastine (ASTELIN) 0.1 % nasal spray Place 1-2 sprays into both nostrils 2 (two) times daily as needed (nasal drainage). Use in each nostril as directed 30 mL 5   Ferrous Sulfate (IRON) 325 (65 Fe) MG TABS 1 tablet Orally Once a day     levalbuterol (XOPENEX HFA) 45 MCG/ACT inhaler Inhale 2 puffs into the lungs every 4 (four) hours as needed for wheezing or shortness of breath (coughing fits). 1 each 2   norgestimate-ethinyl estradiol (ORTHO-CYCLEN) 0.25-35 MG-MCG tablet Take by mouth.     No current facility-administered medications for this visit.    Allergies: Allergies  Allergen Reactions   Banana     Fresh   Other     Red pepper   I reviewed her past medical history, social history, family history, and environmental history and no significant changes have been reported from her previous visit.  Review of Systems  Constitutional:  Negative for appetite change, chills, fever and unexpected weight change.  HENT:  Positive for congestion and rhinorrhea.   Eyes:  Positive for itching.  Respiratory:  Negative for cough, chest tightness, shortness of breath and wheezing.   Cardiovascular:  Negative for chest pain.  Gastrointestinal:  Negative for abdominal pain.  Genitourinary:  Negative for difficulty urinating.  Skin:  Negative for rash.  Allergic/Immunologic: Positive for environmental allergies.  Neurological:  Negative for headaches.    Objective: BP 132/88   Pulse 76   Temp 98.8 F (37.1 C) (Temporal)   Resp 16   SpO2 98%  There is no height or weight on file to calculate BMI. Physical Exam Vitals and nursing note reviewed.  Constitutional:      Appearance: Normal appearance. She is well-developed.  HENT:     Head: Normocephalic and atraumatic.     Right Ear: Tympanic membrane and external ear normal.     Left Ear: Tympanic membrane and external ear normal.     Nose: Nose normal.     Mouth/Throat:     Mouth: Mucous membranes are moist.  Pharynx: Oropharynx is clear.  Eyes:     Conjunctiva/sclera: Conjunctivae normal.  Cardiovascular:     Rate and Rhythm: Normal rate and regular rhythm.     Heart sounds: Normal heart sounds. No murmur heard.    No friction rub. No gallop.  Pulmonary:     Effort: Pulmonary effort is normal.     Breath sounds: Normal breath sounds. No wheezing, rhonchi or rales.  Musculoskeletal:     Cervical back: Neck supple.  Skin:    General: Skin is warm.     Findings: No rash.  Neurological:     Mental Status: She is alert and oriented to person, place, and time.   Psychiatric:        Behavior: Behavior normal.    Previous notes and tests were reviewed. The plan was reviewed with the patient/family, and all questions/concerned were addressed.  It was my pleasure to see Shiane today and participate in her care. Please feel free to contact me with any questions or concerns.  Sincerely,  Wyline Mood, DO Allergy & Immunology  [facility] office: [phone] [facility] office: [phone]

## 2023-05-03 NOTE — Patient Instructions (Addendum)
Environmental allergies 2023 skin testing was positive to grass, weed, ragweed, trees, mold, dog, horse, dust mites, cockroaches. Borderline to tobacco.  Continue environmental control measures.  Wear mask when cutting grass and shower right afterwards, use nettipot to clear out sinuses.  Start Singulair (montelukast) 10mg  daily at night. Cautioned that in some children/adults can experience behavioral changes including hyperactivity, agitation, depression, sleep disturbances and suicidal ideations. These side effects are rare, but if you notice them you should notify me and discontinue Singulair (montelukast). Use over the counter antihistamines such as Zyrtec (cetirizine), Claritin (loratadine), Allegra (fexofenadine), or Xyzal (levocetirizine) daily as needed. May take twice a day during allergy flares. May switch antihistamines every few months. Use azelastine nasal spray 1-2 sprays per nostril twice a day as needed for runny nose/drainage. Use Flonase (fluticasone) nasal spray 1 spray per nostril twice a day as needed for nasal congestion.  Nasal saline spray (i.e., Simply Saline) or nasal saline lavage (i.e., NeilMed) is recommended as needed and prior to medicated nasal sprays. Continue allergy injections with our new vials - if no improvement in 6-12 months consider stopping.   Breathing Normal breathing test today. May use levoalbuterol rescue inhaler 2 puffs every 4 to 6 hours as needed for shortness of breath, chest tightness, coughing, and wheezing.  Monitor frequency of use - if you need to use it more than twice per week on a consistent basis let us know.   Food Continue strict avoidance of red pepper and fresh bananas. Some type of intolerance to avocado. For mild symptoms you can take over the counter antihistamines such as Benadryl and monitor symptoms closely. If symptoms worsen or if you have severe symptoms including breathing issues, throat closure, significant swelling,  whole body hives, severe diarrhea and vomiting, lightheadedness then seek immediate medical care.  Follow up in 3 months or sooner if needed.   Reducing Pollen Exposure Pollen seasons: trees (spring), grass (summer) and ragweed/weeds (fall). Keep windows closed in your home and car to lower pollen exposure.  Install air conditioning in the bedroom and throughout the house if possible.  Avoid going out in dry windy days - especially early morning. Pollen counts are highest between 5 - 10 AM and on dry, hot and windy days.  Save outside activities for late afternoon or after a heavy rain, when pollen levels are lower.  Avoid mowing of grass if you have grass pollen allergy. Be aware that pollen can also be transported indoors on people and pets.  Dry your clothes in an automatic dryer rather than hanging them outside where they might collect pollen.  Rinse hair and eyes before bedtime. Mold Control Mold and fungi can grow on a variety of surfaces provided certain temperature and moisture conditions exist.  Outdoor molds grow on plants, decaying vegetation and soil. The major outdoor mold, Alternaria and Cladosporium, are found in very high numbers during hot and dry conditions. Generally, a late summer - fall peak is seen for common outdoor fungal spores. Rain will temporarily lower outdoor mold spore count, but counts rise rapidly when the rainy period ends. The most important indoor molds are Aspergillus and Penicillium. Dark, humid and poorly ventilated basements are ideal sites for mold growth. The next most common sites of mold growth are the bathroom and the kitchen. Outdoor (Seasonal) Mold Control Use air conditioning and keep windows closed. Avoid exposure to decaying vegetation. Avoid leaf raking. Avoid grain handling. Consider wearing a face mask if working in moldy areas.  Indoor (  Perennial) Mold Control  Maintain humidity below 50%. Get rid of mold growth on hard surfaces with  water, detergent and, if necessary, 5% bleach (do not mix with other cleaners). Then dry the area completely. If mold covers an area more than 10 square feet, consider hiring an indoor environmental professional. For clothing, washing with soap and water is best. If moldy items cannot be cleaned and dried, throw them away. Remove sources e.g. contaminated carpets. Repair and seal leaking roofs or pipes. Using dehumidifiers in damp basements may be helpful, but empty the water and clean units regularly to prevent mildew from forming. All rooms, especially basements, bathrooms and kitchens, require ventilation and cleaning to deter mold and mildew growth. Avoid carpeting on concrete or damp floors, and storing items in damp areas. Control of House Dust Mite Allergen Dust mite allergens are a common trigger of allergy and asthma symptoms. While they can be found throughout the house, these microscopic creatures thrive in warm, humid environments such as bedding, upholstered furniture and carpeting. Because so much time is spent in the bedroom, it is essential to reduce mite levels there.  Encase pillows, mattresses, and box springs in special allergen-proof fabric covers or airtight, zippered plastic covers.  Bedding should be washed weekly in hot water (130 F) and dried in a hot dryer. Allergen-proof covers are available for comforters and pillows that can't be regularly washed.  Wash the allergy-proof covers every few months. Minimize clutter in the bedroom. Keep pets out of the bedroom.  Keep humidity less than 50% by using a dehumidifier or air conditioning. You can buy a humidity measuring device called a hygrometer to monitor this.  If possible, replace carpets with hardwood, linoleum, or washable area rugs. If that's not possible, vacuum frequently with a vacuum that has a HEPA filter. Remove all upholstered furniture and non-washable window drapes from the bedroom. Remove all non-washable stuffed  toys from the bedroom.  Wash stuffed toys weekly. Pet Allergen Avoidance: Contrary to popular opinion, there are no "hypoallergenic" breeds of dogs or cats. That is because people are not allergic to an animal's hair, but to an allergen found in the animal's saliva, dander (dead skin flakes) or urine. Pet allergy symptoms typically occur within minutes. For some people, symptoms can build up and become most severe 8 to 12 hours after contact with the animal. People with severe allergies can experience reactions in public places if dander has been transported on the pet owners' clothing. Keeping an animal outdoors is only a partial solution, since homes with pets in the yard still have higher concentrations of animal allergens. Before getting a pet, ask your allergist to determine if you are allergic to animals. If your pet is already considered part of your family, try to minimize contact and keep the pet out of the bedroom and other rooms where you spend a great deal of time. As with dust mites, vacuum carpets often or replace carpet with a hardwood floor, tile or linoleum. High-efficiency particulate air (HEPA) cleaners can reduce allergen levels over time. While dander and saliva are the source of cat and dog allergens, urine is the source of allergens from rabbits, hamsters, mice and [place] pigs; so ask a non-allergic family member to clean the animal's cage. If you have a pet allergy, talk to your allergist about the potential for allergy immunotherapy (allergy shots). This strategy can often provide long-term relief. Cockroach Allergen Avoidance Cockroaches are often found in the homes of densely populated urban areas,  schools or commercial buildings, but these creatures can lurk almost anywhere. This does not mean that you have a dirty house or living area. Block all areas where roaches can enter the home. This includes crevices, wall cracks and windows.  Cockroaches need water to survive, so fix  and seal all leaky faucets and pipes. Have an exterminator go through the house when your family and pets are gone to eliminate any remaining roaches. Keep food in lidded containers and put pet food dishes away after your pets are done eating. Vacuum and sweep the floor after meals, and take out garbage and recyclables. Use lidded garbage containers in the kitchen. Wash dishes immediately after use and clean under stoves, refrigerators or toasters where crumbs can accumulate. Wipe off the stove and other kitchen surfaces and cupboards regularly.

## 2023-05-04 ENCOUNTER — Encounter: Payer: Self-pay | Admitting: Allergy

## 2023-05-04 DIAGNOSIS — J4520 Mild intermittent asthma, uncomplicated: Secondary | ICD-10-CM | POA: Insufficient documentation

## 2023-05-04 DIAGNOSIS — H1010 Acute atopic conjunctivitis, unspecified eye: Secondary | ICD-10-CM | POA: Insufficient documentation

## 2023-05-04 NOTE — Assessment & Plan Note (Signed)
Noticed chest tightness for 3 days after mowing lawn. Albuterol makes her shaky. No prior asthma diagnosis. Today's spirometry was normal. May use levoalbuterol rescue inhaler 2 puffs every 4 to 6 hours as needed for shortness of breath, chest tightness, coughing, and wheezing.  Monitor frequency of use - if you need to use it more than twice per week on a consistent basis let us know.

## 2023-05-04 NOTE — Assessment & Plan Note (Signed)
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 AIT from age [age] and then again from 84s to now with some benefit. 2015 skin testing positive to trees, grass, weed, ragweed, dust mites, cockroach, dog, cat, mold. 2023 skin testing was positive to grass, weed, ragweed, trees, mold, dog, horse, dust mites, cockroaches. Borderline to tobacco.  Interim history - Not sure if AIT is helping as now noticing some RAD symptoms after cutting grass. Ryaltris not covered.  Continue environmental control measures.  Wear mask when cutting grass and shower right afterwards, use nettipot to clear out sinuses.  Start Singulair (montelukast) 10mg  daily at night. Cautioned that in some children/adults can experience behavioral changes including hyperactivity, agitation, depression, sleep disturbances and suicidal ideations. These side effects are rare, but if you notice them you should notify me and discontinue Singulair (montelukast). Use over the counter antihistamines such as Zyrtec (cetirizine), Claritin (loratadine), Allegra (fexofenadine), or Xyzal (levocetirizine) daily as needed. May take twice a day during allergy flares. May switch antihistamines every few months. Use azelastine nasal spray 1-2 sprays per nostril twice a day as needed for runny nose/drainage. Use Flonase (fluticasone) nasal spray 1 spray per nostril twice a day as needed for nasal congestion.  Nasal saline spray (i.e., Simply Saline) or nasal saline lavage (i.e., NeilMed) is recommended as needed and prior to medicated nasal sprays. Continue allergy injections with new vials formulated by our office - if no improvement in 6-12 months consider stopping.

## 2023-05-04 NOTE — Assessment & Plan Note (Signed)
Past history - perioral pruritus with fresh bananas only, tolerates cooked forms. Red peppers cause abdominal pains but tolerates other forms of pepper. Avocados sometimes cause abdominal pains and sometimes doesn't. 2023 skin testing showed: Negative to avocado, banana. No testing for red pepper available.  Continue strict avoidance of red pepper and fresh bananas. Some type of intolerance to avocado. For mild symptoms you can take over the counter antihistamines such as Benadryl and monitor symptoms closely. If symptoms worsen or if you have severe symptoms including breathing issues, throat closure, significant swelling, whole body hives, severe diarrhea and vomiting, lightheadedness then seek immediate medical care.

## 2023-05-12 ENCOUNTER — Ambulatory Visit (INDEPENDENT_AMBULATORY_CARE_PROVIDER_SITE_OTHER): Payer: 59

## 2023-05-12 DIAGNOSIS — J309 Allergic rhinitis, unspecified: Secondary | ICD-10-CM | POA: Diagnosis not present

## 2023-05-17 ENCOUNTER — Ambulatory Visit (INDEPENDENT_AMBULATORY_CARE_PROVIDER_SITE_OTHER): Payer: 59

## 2023-05-17 DIAGNOSIS — J309 Allergic rhinitis, unspecified: Secondary | ICD-10-CM | POA: Diagnosis not present

## 2023-05-26 ENCOUNTER — Ambulatory Visit (INDEPENDENT_AMBULATORY_CARE_PROVIDER_SITE_OTHER): Payer: 59

## 2023-05-26 DIAGNOSIS — J309 Allergic rhinitis, unspecified: Secondary | ICD-10-CM | POA: Diagnosis not present

## 2023-06-07 ENCOUNTER — Ambulatory Visit (INDEPENDENT_AMBULATORY_CARE_PROVIDER_SITE_OTHER): Payer: 59

## 2023-06-07 DIAGNOSIS — J309 Allergic rhinitis, unspecified: Secondary | ICD-10-CM

## 2023-06-16 ENCOUNTER — Ambulatory Visit (INDEPENDENT_AMBULATORY_CARE_PROVIDER_SITE_OTHER): Payer: 59

## 2023-06-16 DIAGNOSIS — J309 Allergic rhinitis, unspecified: Secondary | ICD-10-CM | POA: Diagnosis not present

## 2023-06-28 ENCOUNTER — Ambulatory Visit (INDEPENDENT_AMBULATORY_CARE_PROVIDER_SITE_OTHER): Payer: 59

## 2023-06-28 DIAGNOSIS — J309 Allergic rhinitis, unspecified: Secondary | ICD-10-CM

## 2023-07-07 ENCOUNTER — Ambulatory Visit (INDEPENDENT_AMBULATORY_CARE_PROVIDER_SITE_OTHER): Payer: 59

## 2023-07-07 DIAGNOSIS — J309 Allergic rhinitis, unspecified: Secondary | ICD-10-CM | POA: Diagnosis not present

## 2023-07-14 ENCOUNTER — Ambulatory Visit (INDEPENDENT_AMBULATORY_CARE_PROVIDER_SITE_OTHER): Payer: 59

## 2023-07-14 DIAGNOSIS — J309 Allergic rhinitis, unspecified: Secondary | ICD-10-CM | POA: Diagnosis not present

## 2023-07-26 ENCOUNTER — Ambulatory Visit (INDEPENDENT_AMBULATORY_CARE_PROVIDER_SITE_OTHER): Payer: 59

## 2023-07-26 DIAGNOSIS — J309 Allergic rhinitis, unspecified: Secondary | ICD-10-CM

## 2023-08-02 ENCOUNTER — Ambulatory Visit (INDEPENDENT_AMBULATORY_CARE_PROVIDER_SITE_OTHER): Payer: 59

## 2023-08-02 DIAGNOSIS — J309 Allergic rhinitis, unspecified: Secondary | ICD-10-CM | POA: Diagnosis not present

## 2023-08-11 ENCOUNTER — Ambulatory Visit: Payer: 59

## 2023-08-11 DIAGNOSIS — J309 Allergic rhinitis, unspecified: Secondary | ICD-10-CM | POA: Diagnosis not present

## 2023-08-18 ENCOUNTER — Ambulatory Visit: Payer: 59

## 2023-08-18 DIAGNOSIS — J309 Allergic rhinitis, unspecified: Secondary | ICD-10-CM | POA: Diagnosis not present

## 2023-08-25 ENCOUNTER — Ambulatory Visit (INDEPENDENT_AMBULATORY_CARE_PROVIDER_SITE_OTHER): Payer: 59

## 2023-08-25 DIAGNOSIS — J309 Allergic rhinitis, unspecified: Secondary | ICD-10-CM

## 2023-09-06 ENCOUNTER — Ambulatory Visit (INDEPENDENT_AMBULATORY_CARE_PROVIDER_SITE_OTHER): Payer: 59

## 2023-09-06 DIAGNOSIS — J309 Allergic rhinitis, unspecified: Secondary | ICD-10-CM

## 2023-09-19 ENCOUNTER — Other Ambulatory Visit: Payer: Self-pay | Admitting: Allergy

## 2023-09-19 MED ORDER — MONTELUKAST SODIUM 10 MG PO TABS: 10.0000 mg | ORAL_TABLET | Freq: Every day | ORAL | 3 refills | Status: DC

## 2023-09-20 ENCOUNTER — Ambulatory Visit (INDEPENDENT_AMBULATORY_CARE_PROVIDER_SITE_OTHER): Payer: 59

## 2023-09-20 DIAGNOSIS — J309 Allergic rhinitis, unspecified: Secondary | ICD-10-CM | POA: Diagnosis not present

## 2023-09-29 ENCOUNTER — Ambulatory Visit: Payer: 59

## 2023-09-29 DIAGNOSIS — J309 Allergic rhinitis, unspecified: Secondary | ICD-10-CM

## 2023-10-04 ENCOUNTER — Encounter: Payer: Self-pay | Admitting: Allergy

## 2023-10-06 ENCOUNTER — Ambulatory Visit: Payer: 59 | Admitting: *Deleted

## 2023-10-06 DIAGNOSIS — J309 Allergic rhinitis, unspecified: Secondary | ICD-10-CM | POA: Diagnosis not present

## 2023-10-18 ENCOUNTER — Ambulatory Visit (INDEPENDENT_AMBULATORY_CARE_PROVIDER_SITE_OTHER): Payer: 59

## 2023-10-18 DIAGNOSIS — J309 Allergic rhinitis, unspecified: Secondary | ICD-10-CM | POA: Diagnosis not present

## 2023-10-25 ENCOUNTER — Ambulatory Visit (INDEPENDENT_AMBULATORY_CARE_PROVIDER_SITE_OTHER): Payer: 59

## 2023-10-25 DIAGNOSIS — J309 Allergic rhinitis, unspecified: Secondary | ICD-10-CM | POA: Diagnosis not present

## 2023-11-03 ENCOUNTER — Ambulatory Visit: Payer: 59

## 2023-11-03 DIAGNOSIS — J309 Allergic rhinitis, unspecified: Secondary | ICD-10-CM | POA: Diagnosis not present

## 2023-11-17 ENCOUNTER — Ambulatory Visit (INDEPENDENT_AMBULATORY_CARE_PROVIDER_SITE_OTHER): Payer: 59

## 2023-11-17 DIAGNOSIS — J309 Allergic rhinitis, unspecified: Secondary | ICD-10-CM

## 2023-11-24 ENCOUNTER — Ambulatory Visit (INDEPENDENT_AMBULATORY_CARE_PROVIDER_SITE_OTHER): Payer: 59

## 2023-11-24 DIAGNOSIS — J309 Allergic rhinitis, unspecified: Secondary | ICD-10-CM | POA: Diagnosis not present

## 2023-12-01 ENCOUNTER — Ambulatory Visit: Payer: 59

## 2023-12-01 DIAGNOSIS — J309 Allergic rhinitis, unspecified: Secondary | ICD-10-CM

## 2023-12-08 ENCOUNTER — Ambulatory Visit: Payer: 59

## 2023-12-08 DIAGNOSIS — J309 Allergic rhinitis, unspecified: Secondary | ICD-10-CM | POA: Diagnosis not present

## 2023-12-27 ENCOUNTER — Ambulatory Visit (INDEPENDENT_AMBULATORY_CARE_PROVIDER_SITE_OTHER): Payer: 59

## 2023-12-27 DIAGNOSIS — J309 Allergic rhinitis, unspecified: Secondary | ICD-10-CM

## 2024-01-03 ENCOUNTER — Ambulatory Visit (INDEPENDENT_AMBULATORY_CARE_PROVIDER_SITE_OTHER): Payer: 59

## 2024-01-03 DIAGNOSIS — J309 Allergic rhinitis, unspecified: Secondary | ICD-10-CM | POA: Diagnosis not present

## 2024-01-04 DIAGNOSIS — J3089 Other allergic rhinitis: Secondary | ICD-10-CM | POA: Diagnosis not present

## 2024-01-04 NOTE — Progress Notes (Signed)
 VIAL ONE EXP 01-04-24. EXP 01-03-25.

## 2024-01-12 ENCOUNTER — Ambulatory Visit (INDEPENDENT_AMBULATORY_CARE_PROVIDER_SITE_OTHER): Payer: 59

## 2024-01-12 DIAGNOSIS — J309 Allergic rhinitis, unspecified: Secondary | ICD-10-CM | POA: Diagnosis not present

## 2024-01-19 ENCOUNTER — Ambulatory Visit (INDEPENDENT_AMBULATORY_CARE_PROVIDER_SITE_OTHER)

## 2024-01-19 DIAGNOSIS — J309 Allergic rhinitis, unspecified: Secondary | ICD-10-CM | POA: Diagnosis not present

## 2024-01-26 ENCOUNTER — Ambulatory Visit (INDEPENDENT_AMBULATORY_CARE_PROVIDER_SITE_OTHER)

## 2024-01-26 DIAGNOSIS — J309 Allergic rhinitis, unspecified: Secondary | ICD-10-CM

## 2024-02-02 ENCOUNTER — Ambulatory Visit (INDEPENDENT_AMBULATORY_CARE_PROVIDER_SITE_OTHER)

## 2024-02-02 DIAGNOSIS — J309 Allergic rhinitis, unspecified: Secondary | ICD-10-CM | POA: Diagnosis not present

## 2024-02-09 ENCOUNTER — Ambulatory Visit (INDEPENDENT_AMBULATORY_CARE_PROVIDER_SITE_OTHER)

## 2024-02-09 DIAGNOSIS — J309 Allergic rhinitis, unspecified: Secondary | ICD-10-CM

## 2024-02-16 ENCOUNTER — Ambulatory Visit (INDEPENDENT_AMBULATORY_CARE_PROVIDER_SITE_OTHER)

## 2024-02-16 DIAGNOSIS — J309 Allergic rhinitis, unspecified: Secondary | ICD-10-CM

## 2024-02-23 ENCOUNTER — Ambulatory Visit (INDEPENDENT_AMBULATORY_CARE_PROVIDER_SITE_OTHER)

## 2024-02-23 DIAGNOSIS — J309 Allergic rhinitis, unspecified: Secondary | ICD-10-CM

## 2024-03-06 ENCOUNTER — Ambulatory Visit (INDEPENDENT_AMBULATORY_CARE_PROVIDER_SITE_OTHER)

## 2024-03-06 DIAGNOSIS — J309 Allergic rhinitis, unspecified: Secondary | ICD-10-CM

## 2024-03-13 ENCOUNTER — Ambulatory Visit (INDEPENDENT_AMBULATORY_CARE_PROVIDER_SITE_OTHER)

## 2024-03-13 DIAGNOSIS — J309 Allergic rhinitis, unspecified: Secondary | ICD-10-CM | POA: Diagnosis not present

## 2024-03-20 ENCOUNTER — Ambulatory Visit (INDEPENDENT_AMBULATORY_CARE_PROVIDER_SITE_OTHER)

## 2024-03-20 DIAGNOSIS — J309 Allergic rhinitis, unspecified: Secondary | ICD-10-CM

## 2024-03-27 ENCOUNTER — Ambulatory Visit (INDEPENDENT_AMBULATORY_CARE_PROVIDER_SITE_OTHER)

## 2024-03-27 DIAGNOSIS — J309 Allergic rhinitis, unspecified: Secondary | ICD-10-CM | POA: Diagnosis not present

## 2024-04-03 ENCOUNTER — Ambulatory Visit (INDEPENDENT_AMBULATORY_CARE_PROVIDER_SITE_OTHER)

## 2024-04-03 DIAGNOSIS — J309 Allergic rhinitis, unspecified: Secondary | ICD-10-CM

## 2024-04-10 ENCOUNTER — Ambulatory Visit (INDEPENDENT_AMBULATORY_CARE_PROVIDER_SITE_OTHER)

## 2024-04-10 DIAGNOSIS — J309 Allergic rhinitis, unspecified: Secondary | ICD-10-CM

## 2024-04-10 NOTE — Progress Notes (Signed)
 VIALS MADE 04-10-24

## 2024-04-12 DIAGNOSIS — J3089 Other allergic rhinitis: Secondary | ICD-10-CM | POA: Diagnosis not present

## 2024-04-13 DIAGNOSIS — J3081 Allergic rhinitis due to animal (cat) (dog) hair and dander: Secondary | ICD-10-CM | POA: Diagnosis not present

## 2024-04-24 ENCOUNTER — Ambulatory Visit (INDEPENDENT_AMBULATORY_CARE_PROVIDER_SITE_OTHER)

## 2024-04-24 DIAGNOSIS — J309 Allergic rhinitis, unspecified: Secondary | ICD-10-CM

## 2024-05-03 ENCOUNTER — Ambulatory Visit (INDEPENDENT_AMBULATORY_CARE_PROVIDER_SITE_OTHER)

## 2024-05-03 DIAGNOSIS — J309 Allergic rhinitis, unspecified: Secondary | ICD-10-CM

## 2024-05-17 ENCOUNTER — Ambulatory Visit (INDEPENDENT_AMBULATORY_CARE_PROVIDER_SITE_OTHER)

## 2024-05-17 DIAGNOSIS — J309 Allergic rhinitis, unspecified: Secondary | ICD-10-CM | POA: Diagnosis not present

## 2024-05-31 ENCOUNTER — Ambulatory Visit (INDEPENDENT_AMBULATORY_CARE_PROVIDER_SITE_OTHER)

## 2024-05-31 DIAGNOSIS — J309 Allergic rhinitis, unspecified: Secondary | ICD-10-CM | POA: Diagnosis not present

## 2024-06-14 ENCOUNTER — Ambulatory Visit (INDEPENDENT_AMBULATORY_CARE_PROVIDER_SITE_OTHER)

## 2024-06-14 DIAGNOSIS — J309 Allergic rhinitis, unspecified: Secondary | ICD-10-CM | POA: Diagnosis not present

## 2024-06-21 ENCOUNTER — Ambulatory Visit (INDEPENDENT_AMBULATORY_CARE_PROVIDER_SITE_OTHER)

## 2024-06-21 DIAGNOSIS — J309 Allergic rhinitis, unspecified: Secondary | ICD-10-CM | POA: Diagnosis not present

## 2024-06-26 ENCOUNTER — Ambulatory Visit: Admitting: Allergy

## 2024-06-26 ENCOUNTER — Encounter: Payer: Self-pay | Admitting: Allergy

## 2024-06-26 VITALS — BP 138/100 | HR 59 | Temp 98.0°F | Ht 63.0 in | Wt 170.0 lb

## 2024-06-26 DIAGNOSIS — J4520 Mild intermittent asthma, uncomplicated: Secondary | ICD-10-CM | POA: Diagnosis not present

## 2024-06-26 DIAGNOSIS — T781XXD Other adverse food reactions, not elsewhere classified, subsequent encounter: Secondary | ICD-10-CM

## 2024-06-26 DIAGNOSIS — J3089 Other allergic rhinitis: Secondary | ICD-10-CM

## 2024-06-26 DIAGNOSIS — J302 Other seasonal allergic rhinitis: Secondary | ICD-10-CM

## 2024-06-26 DIAGNOSIS — R030 Elevated blood-pressure reading, without diagnosis of hypertension: Secondary | ICD-10-CM | POA: Diagnosis not present

## 2024-06-26 DIAGNOSIS — H1013 Acute atopic conjunctivitis, bilateral: Secondary | ICD-10-CM

## 2024-06-26 DIAGNOSIS — H1010 Acute atopic conjunctivitis, unspecified eye: Secondary | ICD-10-CM

## 2024-06-26 MED ORDER — MONTELUKAST SODIUM 10 MG PO TABS: 10.0000 mg | ORAL_TABLET | Freq: Every day | ORAL | 3 refills | Status: AC

## 2024-06-26 NOTE — Patient Instructions (Addendum)
 Environmental allergies 2023 skin testing was positive to grass, weed, ragweed, trees, mold, dog, horse, dust mites, cockroaches. Borderline to tobacco.  Continue environmental control measures.  Continue Singulair  (montelukast ) 10mg  daily at night. Use over the counter antihistamines such as Zyrtec (cetirizine), Claritin (loratadine), Allegra (fexofenadine), or Xyzal (levocetirizine) daily as needed. May take twice a day during allergy  flares. May switch antihistamines every few months. Use azelastine  nasal spray 1-2 sprays per nostril twice a day as needed for runny nose/drainage. Use Flonase (fluticasone) nasal spray 1 spray per nostril twice a day as needed for nasal congestion.  Nasal saline spray (i.e., Simply Saline) or nasal saline lavage (i.e., NeilMed) is recommended as needed and prior to medicated nasal sprays. Continue allergy  injections.   Breathing May use levoalbuterol rescue inhaler 2 puffs every 4 to 6 hours as needed for shortness of breath, chest tightness, coughing, and wheezing.  Monitor frequency of use - if you need to use it more than twice per week on a consistent basis let us  know.   Food Continue strict avoidance of red pepper and fresh bananas. Some type of intolerance to avocado. For mild symptoms you can take over the counter antihistamines such as zyrtec and monitor symptoms closely. If symptoms worsen or if you have severe symptoms including breathing issues, throat closure, significant swelling, whole body hives, severe diarrhea and vomiting, lightheadedness then seek immediate medical care.  Elevated blood pressure  Blood pressure reading was high in our office today. Vitals:   06/26/24 1604 06/26/24 1617  BP: (!) 128/92 (!) 138/100   Please follow up with PCP regarding this.    Follow up in 12 months or sooner if needed.   Reducing Pollen Exposure Pollen seasons: trees (spring), grass (summer) and ragweed/weeds (fall). Keep windows closed in your  home and car to lower pollen exposure.  Install air conditioning in the bedroom and throughout the house if possible.  Avoid going out in dry windy days - especially early morning. Pollen counts are highest between 5 - 10 AM and on dry, hot and windy days.  Save outside activities for late afternoon or after a heavy rain, when pollen levels are lower.  Avoid mowing of grass if you have grass pollen allergy . Be aware that pollen can also be transported indoors on people and pets.  Dry your clothes in an automatic dryer rather than hanging them outside where they might collect pollen.  Rinse hair and eyes before bedtime. Mold Control Mold and fungi can grow on a variety of surfaces provided certain temperature and moisture conditions exist.  Outdoor molds grow on plants, decaying vegetation and soil. The major outdoor mold, Alternaria and Cladosporium, are found in very high numbers during hot and dry conditions. Generally, a late summer - fall peak is seen for common outdoor fungal spores. Rain will temporarily lower outdoor mold spore count, but counts rise rapidly when the rainy period ends. The most important indoor molds are Aspergillus and Penicillium. Dark, humid and poorly ventilated basements are ideal sites for mold growth. The next most common sites of mold growth are the bathroom and the kitchen. Outdoor (Seasonal) Mold Control Use air conditioning and keep windows closed. Avoid exposure to decaying vegetation. Avoid leaf raking. Avoid grain handling. Consider wearing a face mask if working in moldy areas.  Indoor (Perennial) Mold Control  Maintain humidity below 50%. Get rid of mold growth on hard surfaces with water, detergent and, if necessary, 5% bleach (do not mix with other cleaners). Then  dry the area completely. If mold covers an area more than 10 square feet, consider hiring an indoor environmental professional. For clothing, washing with soap and water is best. If moldy  items cannot be cleaned and dried, throw them away. Remove sources e.g. contaminated carpets. Repair and seal leaking roofs or pipes. Using dehumidifiers in damp basements may be helpful, but empty the water and clean units regularly to prevent mildew from forming. All rooms, especially basements, bathrooms and kitchens, require ventilation and cleaning to deter mold and mildew growth. Avoid carpeting on concrete or damp floors, and storing items in damp areas. Control of House Dust Mite Allergen Dust mite allergens are a common trigger of allergy  and asthma symptoms. While they can be found throughout the house, these microscopic creatures thrive in warm, humid environments such as bedding, upholstered furniture and carpeting. Because so much time is spent in the bedroom, it is essential to reduce mite levels there.  Encase pillows, mattresses, and box springs in special allergen-proof fabric covers or airtight, zippered plastic covers.  Bedding should be washed weekly in hot water (130 F) and dried in a hot dryer. Allergen-proof covers are available for comforters and pillows that can't be regularly washed.  Wash the allergy -proof covers every few months. Minimize clutter in the bedroom. Keep pets out of the bedroom.  Keep humidity less than 50% by using a dehumidifier or air conditioning. You can buy a humidity measuring device called a hygrometer to monitor this.  If possible, replace carpets with hardwood, linoleum, or washable area rugs. If that's not possible, vacuum frequently with a vacuum that has a HEPA filter. Remove all upholstered furniture and non-washable window drapes from the bedroom. Remove all non-washable stuffed toys from the bedroom.  Wash stuffed toys weekly. Pet Allergen Avoidance: Contrary to popular opinion, there are no "hypoallergenic" breeds of dogs or cats. That is because people are not allergic to an animal's hair, but to an allergen found in the animal's saliva,  dander (dead skin flakes) or urine. Pet allergy  symptoms typically occur within minutes. For some people, symptoms can build up and become most severe 8 to 12 hours after contact with the animal. People with severe allergies can experience reactions in public places if dander has been transported on the pet owners' clothing. Keeping an animal outdoors is only a partial solution, since homes with pets in the yard still have higher concentrations of animal allergens. Before getting a pet, ask your allergist to determine if you are allergic to animals. If your pet is already considered part of your family, try to minimize contact and keep the pet out of the bedroom and other rooms where you spend a great deal of time. As with dust mites, vacuum carpets often or replace carpet with a hardwood floor, tile or linoleum. High-efficiency particulate air (HEPA) cleaners can reduce allergen levels over time. While dander and saliva are the source of cat and dog allergens, urine is the source of allergens from rabbits, hamsters, mice and [place] pigs; so ask a non-allergic family member to clean the animal's cage. If you have a pet allergy , talk to your allergist about the potential for allergy  immunotherapy (allergy  shots). This strategy can often provide long-term relief. Cockroach Allergen Avoidance Cockroaches are often found in the homes of densely populated urban areas, schools or commercial buildings, but these creatures can lurk almost anywhere. This does not mean that you have a dirty house or living area. Block all areas where roaches can enter  the home. This includes crevices, wall cracks and windows.  Cockroaches need water to survive, so fix and seal all leaky faucets and pipes. Have an exterminator go through the house when your family and pets are gone to eliminate any remaining roaches. Keep food in lidded containers and put pet food dishes away after your pets are done eating. Vacuum and sweep the  floor after meals, and take out garbage and recyclables. Use lidded garbage containers in the kitchen. Wash dishes immediately after use and clean under stoves, refrigerators or toasters where crumbs can accumulate. Wipe off the stove and other kitchen surfaces and cupboards regularly.

## 2024-06-26 NOTE — Progress Notes (Signed)
 Follow Up Note  RE: Kathy Shields MRN: [id number] DOB: [birth year] Date of Office Visit: 06/26/2024  Referring provider: No ref. provider found Primary care provider: Cindy Clotilda HERO, DO (Inactive)  Chief Complaint: Follow-up  History of Present Illness: I had the pleasure of seeing Kathy Shields for a follow up visit at the [facility] on 06/26/2024. She is a 48 y.o. female, who is being followed for allergic rhinoconjunctivitis on AIT, reactive airway disease and adverse food reaction. Her previous allergy  office visit was on 05/03/2023 with Dr. Luke. Today is a regular follow up visit.  Discussed the use of AI scribe software for clinical note transcription with the patient, who gave verbal consent to proceed.    Last year's allergy  season was particularly severe for her, but this year has been better. She continues to receive allergy  shots and has experienced some instances where the injection site reaction was larger than expected.   She takes a daily Zyrtec and Singulair  for her allergies. She does not use nasal sprays or an inhaler currently, as she has not experienced chest tightness after mowing the lawn this season, which was a previous issue.  She continues to avoid red peppers and fresh bananas due to past reactions and has not had any recent allergic reactions. She is able to mow the grass without wearing protective gear, which she previously needed.     Assessment and Plan: Kathy Shields is a 48 y.o. 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 AIT from age [age] and then again from 8s to now with some benefit. 2015 skin testing positive to trees, grass, weed, ragweed, dust mites, cockroach, dog, cat, mold. 2023 skin testing positive to grass, weed, ragweed, trees, mold, dog, horse, dust mites, cockroaches. Borderline to tobacco.  Interim history - able to mow grass without any  issues. Localized reactions from AIT.  Continue environmental control measures.  Continue Singulair  (montelukast ) 10mg  daily at night. Use over the counter antihistamines such as Zyrtec (cetirizine), Claritin (loratadine), Allegra (fexofenadine), or Xyzal (levocetirizine) daily as needed. May take twice a day during allergy  flares. May switch antihistamines every few months. Use azelastine  nasal spray 1-2 sprays per nostril twice a day as needed for runny nose/drainage. Use Flonase (fluticasone) nasal spray 1 spray per nostril twice a day as needed for nasal congestion.  Nasal saline spray (i.e., Simply Saline) or nasal saline lavage (i.e., NeilMed) is recommended as needed and prior to medicated nasal sprays. Continue allergy  injections.    Reactive airway disease, mild intermittent, uncomplicated No inhaler use and no issues after mowing grass anymore.  May use levoalbuterol rescue inhaler 2 puffs every 4 to 6 hours as needed for shortness of breath, chest tightness, coughing, and wheezing.  Monitor frequency of use - if you need to use it more than twice per week on a consistent basis let us  know.    Other adverse food reactions, not elsewhere classified, subsequent encounter Past history - perioral pruritus with fresh bananas only, tolerates cooked forms. Red peppers cause abdominal pains but tolerates other forms of pepper. Avocados sometimes cause abdominal pains and sometimes doesn't. 2023 skin testing showed: Negative to avocado, banana. No testing for red pepper available.  Continue strict avoidance of red pepper and fresh bananas. Some type of intolerance to avocado. For mild symptoms you can take over the counter antihistamines such as zyrtec and monitor symptoms closely. If symptoms worsen or if  you have severe symptoms including breathing issues, throat closure, significant swelling, whole body hives, severe diarrhea and vomiting, lightheadedness then seek immediate medical  care.  Elevated blood pressure reading Please follow up with PCP regarding this.     Return in about 1 year (around 06/26/2025).  Meds ordered this encounter  Medications   montelukast  (SINGULAIR ) 10 MG tablet    Sig: Take 1 tablet (10 mg total) by mouth at bedtime.    Dispense:  90 tablet    Refill:  3   Lab Orders  No laboratory test(s) ordered today    Diagnostics: None.    Medication List:  Current Outpatient Medications  Medication Sig Dispense Refill   azelastine  (ASTELIN ) 0.1 % nasal spray Place 1-2 sprays into both nostrils 2 (two) times daily as needed (nasal drainage). Use in each nostril as directed 30 mL 5   estradiol (VIVELLE-DOT) 0.05 MG/24HR patch Place 1 patch onto the skin 2 (two) times a week.     Ferrous Sulfate  (IRON) 325 (65 Fe) MG TABS 1 tablet Orally Once a day     levalbuterol  (XOPENEX  HFA) 45 MCG/ACT inhaler Inhale 2 puffs into the lungs every 4 (four) hours as needed for wheezing or shortness of breath (coughing fits). 1 each 2   MYFEMBREE 40-1-0.5 MG TABS Take 1 tablet by mouth daily.     montelukast  (SINGULAIR ) 10 MG tablet Take 1 tablet (10 mg total) by mouth at bedtime. 90 tablet 3   No current facility-administered medications for this visit.   Allergies: Allergies  Allergen Reactions   Banana     Fresh   Other     Red pepper   I reviewed her past medical history, social history, family history, and environmental history and no significant changes have been reported from her previous visit.  Review of Systems  Constitutional:  Negative for appetite change, chills, fever and unexpected weight change.  HENT:  Negative for congestion and rhinorrhea.   Eyes:  Negative for itching.  Respiratory:  Negative for cough, chest tightness, shortness of breath and wheezing.   Cardiovascular:  Negative for chest pain.  Gastrointestinal:  Negative for abdominal pain.  Genitourinary:  Negative for difficulty urinating.  Skin:  Negative for rash.   Allergic/Immunologic: Positive for environmental allergies.  Neurological:  Negative for headaches.    Objective: BP (!) 138/100   Pulse (!) 59   Temp 98 F (36.7 C)   Ht 5' 3 (1.6 m)   Wt 170 lb (77.1 kg)   SpO2 97%   BMI 30.11 kg/m  Body mass index is 30.11 kg/m. Physical Exam Vitals and nursing note reviewed.  Constitutional:      Appearance: Normal appearance. She is well-developed.  HENT:     Head: Normocephalic and atraumatic.     Right Ear: Tympanic membrane and external ear normal.     Left Ear: Tympanic membrane and external ear normal.     Nose: Nose normal.     Mouth/Throat:     Mouth: Mucous membranes are moist.     Pharynx: Oropharynx is clear.  Eyes:     Conjunctiva/sclera: Conjunctivae normal.  Cardiovascular:     Rate and Rhythm: Normal rate and regular rhythm.     Heart sounds: Normal heart sounds. No murmur heard.    No friction rub. No gallop.  Pulmonary:     Effort: Pulmonary effort is normal.     Breath sounds: Normal breath sounds. No wheezing, rhonchi or rales.  Musculoskeletal:  Cervical back: Neck supple.  Skin:    General: Skin is warm.     Findings: No rash.  Neurological:     Mental Status: She is alert and oriented to person, place, and time.  Psychiatric:        Behavior: Behavior normal.    Previous notes and tests were reviewed. The plan was reviewed with the patient/family, and all questions/concerned were addressed.  It was my pleasure to see Kathy Shields today and participate in her care. Please feel free to contact me with any questions or concerns.  Sincerely,  Orlan Cramp, DO Allergy  & Immunology  [facility]  [place] office: [phone] [facility] office: [phone]

## 2024-06-28 ENCOUNTER — Ambulatory Visit (INDEPENDENT_AMBULATORY_CARE_PROVIDER_SITE_OTHER)

## 2024-06-28 DIAGNOSIS — J302 Other seasonal allergic rhinitis: Secondary | ICD-10-CM

## 2024-06-28 DIAGNOSIS — H1010 Acute atopic conjunctivitis, unspecified eye: Secondary | ICD-10-CM | POA: Diagnosis not present

## 2024-06-28 DIAGNOSIS — J3089 Other allergic rhinitis: Secondary | ICD-10-CM | POA: Diagnosis not present

## 2024-07-26 ENCOUNTER — Ambulatory Visit (INDEPENDENT_AMBULATORY_CARE_PROVIDER_SITE_OTHER)

## 2024-07-26 DIAGNOSIS — J309 Allergic rhinitis, unspecified: Secondary | ICD-10-CM | POA: Diagnosis not present

## 2024-08-28 ENCOUNTER — Ambulatory Visit (INDEPENDENT_AMBULATORY_CARE_PROVIDER_SITE_OTHER)

## 2024-08-28 DIAGNOSIS — J309 Allergic rhinitis, unspecified: Secondary | ICD-10-CM

## 2024-09-20 ENCOUNTER — Ambulatory Visit (INDEPENDENT_AMBULATORY_CARE_PROVIDER_SITE_OTHER)

## 2024-09-20 DIAGNOSIS — J309 Allergic rhinitis, unspecified: Secondary | ICD-10-CM

## 2024-10-16 ENCOUNTER — Ambulatory Visit

## 2024-10-16 DIAGNOSIS — J309 Allergic rhinitis, unspecified: Secondary | ICD-10-CM

## 2024-10-18 NOTE — Progress Notes (Signed)
 VIALS MADE ON 10/19/24

## 2024-10-19 DIAGNOSIS — J301 Allergic rhinitis due to pollen: Secondary | ICD-10-CM | POA: Diagnosis not present

## 2024-10-19 DIAGNOSIS — J3089 Other allergic rhinitis: Secondary | ICD-10-CM | POA: Diagnosis not present

## 2024-10-19 DIAGNOSIS — J302 Other seasonal allergic rhinitis: Secondary | ICD-10-CM | POA: Diagnosis not present

## 2024-10-19 DIAGNOSIS — J3081 Allergic rhinitis due to animal (cat) (dog) hair and dander: Secondary | ICD-10-CM | POA: Diagnosis not present

## 2024-11-13 ENCOUNTER — Ambulatory Visit (INDEPENDENT_AMBULATORY_CARE_PROVIDER_SITE_OTHER): Admitting: *Deleted

## 2024-11-13 DIAGNOSIS — J309 Allergic rhinitis, unspecified: Secondary | ICD-10-CM | POA: Diagnosis not present

## 2024-12-11 ENCOUNTER — Ambulatory Visit

## 2024-12-11 DIAGNOSIS — J302 Other seasonal allergic rhinitis: Secondary | ICD-10-CM | POA: Diagnosis not present
